# Patient Record
Sex: Male | Born: 1990
Health system: Southern US, Community
[De-identification: ages and names within clinical notes are randomized; demographics above are authoritative.]

## PROBLEM LIST (undated history)

## (undated) DIAGNOSIS — Q909 Down syndrome, unspecified: Secondary | ICD-10-CM

## (undated) DIAGNOSIS — F840 Autistic disorder: Secondary | ICD-10-CM

## (undated) DIAGNOSIS — R011 Cardiac murmur, unspecified: Secondary | ICD-10-CM

## (undated) DIAGNOSIS — R1110 Vomiting, unspecified: Secondary | ICD-10-CM

## (undated) HISTORY — DX: Vomiting, unspecified: R11.10

## (undated) HISTORY — DX: Cardiac murmur, unspecified: R01.1

## (undated) HISTORY — DX: Down syndrome, unspecified: Q90.9

## (undated) HISTORY — PX: DENTAL SURGERY: SHX609

## (undated) HISTORY — DX: Autistic disorder: F84.0

## (undated) HISTORY — PX: CARDIAC SURGERY: SHX584

---
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
## 2013-05-21 DIAGNOSIS — Z8774 Personal history of (corrected) congenital malformations of heart and circulatory system: Secondary | ICD-10-CM | POA: Insufficient documentation

## 2013-05-21 DIAGNOSIS — Q244 Congenital subaortic stenosis: Secondary | ICD-10-CM | POA: Insufficient documentation

## 2015-02-08 ENCOUNTER — Encounter: Payer: Self-pay | Admitting: Family

## 2015-02-08 ENCOUNTER — Ambulatory Visit (INDEPENDENT_AMBULATORY_CARE_PROVIDER_SITE_OTHER): Payer: 59 | Admitting: Family

## 2015-02-08 VITALS — BP 110/78 | HR 58 | Resp 14 | Ht 60.0 in

## 2015-02-08 DIAGNOSIS — R1111 Vomiting without nausea: Secondary | ICD-10-CM

## 2015-02-08 DIAGNOSIS — Q909 Down syndrome, unspecified: Secondary | ICD-10-CM | POA: Diagnosis not present

## 2015-02-08 DIAGNOSIS — R32 Unspecified urinary incontinence: Secondary | ICD-10-CM

## 2015-02-08 DIAGNOSIS — F840 Autistic disorder: Secondary | ICD-10-CM | POA: Diagnosis not present

## 2015-02-08 DIAGNOSIS — R1110 Vomiting, unspecified: Secondary | ICD-10-CM | POA: Insufficient documentation

## 2015-02-08 NOTE — Assessment & Plan Note (Signed)
Occasional urinary incontinence managed with briefs. Prescription written for medical supplies per patient request.

## 2015-02-08 NOTE — Assessment & Plan Note (Signed)
Symptoms of vomiting following meals with concern for gastroesophageal reflux or possible pyloric stenosis. Previous workup including barium swallow has been negative. Patient exam limited secondary to his comorbid conditions. Symptoms may also be related to constipation. Recommend continued over-the-counter interventions to decrease constipation including MiraLAX and prune juice as needed. Referral to gastroenterology placed for possible endoscopy. Follow-up if symptoms worsen prior to gastroenterology appointment.

## 2015-02-08 NOTE — Assessment & Plan Note (Signed)
Patient in need of supramalleolar orthotics secondary to Down syndrome development. Refer to orthopedics for further assessment and management. Follow-up if symptoms scratch that follow-up if needed prior to orthopedic appointment.

## 2015-02-08 NOTE — Progress Notes (Signed)
Subjective:    Patient ID: Nathaniel Jimenez, male    DOB: [birth year], 25 y.o.   MRN: [id number]  Chief Complaint  Patient presents with  . Establish Care    referral to GI for stomach issues, non verbal, down syndrome, and autistic    HPI:  [name] is a 25 y.o. male who  has a past medical history of Down syndrome; Autism; Heart murmur; Heart murmur; Autism; and Down syndrome. and presents today for an acute office visit to establish care. His parents are present for today's visit and provide the information as the patient has down syndrome, is non-communicative and has autism.   1.) Stomach issues - Associated symptom vomiting which occurs after he eats has been going on for about 3 years . Modifying factors include walking around which sometimes helps and prevents it. When he was little he tended to have occasional projectile vomiting. Vomiting now is described as dry heaving following by vomiting that does not appear to be projectile. Does not occur with every meal, but most meals. Has also attempted to change combinations of foods and reducing carbohydrates and sweets. Has previous been tested for Celiac and GERD as much as tolerated and has not found much conclusion. Does not appear to be in pain. Has been seen by gastroenterology in [place] with records to follow. Parents indicate the next step was recommended to be an endoscopy. He has also had a barium swallow which was normal. Parents with concern for possible migraine headaches, but unsure. Parents also express concern for constipation with bowel frequency of about 3 times per week and has been relieved with prune juice and Miralax in the past but is not used often.   2.) Need for medical equipment - Parents are requesting medical equipment to assist with care of patient. In need of Depends as patient tends to have issues with urinary incontenance and generally wears a small-medium. He is also in need of supramalleolar orthotics as his  current one are excessively worn.  No Known Allergies   No outpatient prescriptions prior to visit.   No facility-administered medications prior to visit.     Past Medical History  Diagnosis Date  . Down syndrome   . Autism   . Heart murmur   . Heart murmur   . Autism   . Down syndrome      Past Surgical History  Procedure Laterality Date  . Cardiac surgery       Family History  Problem Relation Age of Onset  . Healthy Mother   . Healthy Father   . Cancer Maternal Grandmother   . Bladder Cancer Maternal Grandfather   . Healthy Paternal Grandmother   . Healthy Paternal Grandfather      Social History   Social History  . Marital Status: Single    Spouse Name: N/A  . Number of Children: 0  . Years of Education: N/A   Occupational History  . Not on file.   Social History Main Topics  . Smoking status: Never Smoker   . Smokeless tobacco: Never Used  . Alcohol Use: No  . Drug Use: No  . Sexual Activity: Not on file   Other Topics Concern  . Not on file   Social History Narrative   Lives with parents and is non-verbal, autistic and has down syndrome.     Review of Systems  Constitutional: Negative for fever and chills.  Gastrointestinal: Positive for vomiting and constipation. Negative for abdominal pain, diarrhea  and blood in stool.      Objective:    BP 110/78 mmHg  Pulse 58  Resp 14  Ht 5' (1.524 m)  SpO2 98% Nursing note and vital signs reviewed.  Physical Exam  Constitutional: He appears well-developed and well-nourished. He appears listless. No distress.  Patient is seated in the chair between his parents who are continuously working to help keep the patient calm. Non-communicative.   Cardiovascular: Normal rate, regular rhythm, normal heart sounds and intact distal pulses.   Pulmonary/Chest: Effort normal and breath sounds normal.  Neurological: He appears listless.  Unable to assess orientation status as patient is non-communicative  with down syndrome and autism.   Skin: Skin is warm and dry.  Psychiatric:  Patient is restless and not able to sit still for long periods of time during office visit.        Assessment & Plan:   Problem List Items Addressed This Visit      Other   Vomiting - Primary    Symptoms of vomiting following meals with concern for gastroesophageal reflux or possible pyloric stenosis. Previous workup including barium swallow has been negative. Patient exam limited secondary to his comorbid conditions. Symptoms may also be related to constipation. Recommend continued over-the-counter interventions to decrease constipation including MiraLAX and prune juice as needed. Referral to gastroenterology placed for possible endoscopy. Follow-up if symptoms worsen prior to gastroenterology appointment.      Relevant Orders   Ambulatory referral to Gastroenterology   Down syndrome    Patient in need of supramalleolar orthotics secondary to Down syndrome development. Refer to orthopedics for further assessment and management. Follow-up if symptoms scratch that follow-up if needed prior to orthopedic appointment.      Relevant Orders   AMB referral to orthopedics   Autism   Urinary incontinence    Occasional urinary incontinence managed with briefs. Prescription written for medical supplies per patient request.

## 2015-02-08 NOTE — Patient Instructions (Addendum)
Thank you for choosing [facility].  Summary/Instructions:  They will call with your referrals for orthopedics and gastroenterology.   If your symptoms worsen or fail to improve, please contact our office for further instruction, or in case of emergency go directly to the emergency room at the closest medical facility.   Nausea and Vomiting Nausea is a sick feeling that often comes before throwing up (vomiting). Vomiting is a reflex where stomach contents come out of your mouth. Vomiting can cause severe loss of body fluids (dehydration). Children and elderly adults can become dehydrated quickly, especially if they also have diarrhea. Nausea and vomiting are symptoms of a condition or disease. It is important to find the cause of your symptoms. CAUSES   Direct irritation of the stomach lining. This irritation can result from increased acid production (gastroesophageal reflux disease), infection, food poisoning, taking certain medicines (such as nonsteroidal anti-inflammatory drugs), alcohol use, or tobacco use.  Signals from the brain.These signals could be caused by a headache, heat exposure, an inner ear disturbance, increased pressure in the brain from injury, infection, a tumor, or a concussion, pain, emotional stimulus, or metabolic problems.  An obstruction in the gastrointestinal tract (bowel obstruction).  Illnesses such as diabetes, hepatitis, gallbladder problems, appendicitis, kidney problems, cancer, sepsis, atypical symptoms of a heart attack, or eating disorders.  Medical treatments such as chemotherapy and radiation.  Receiving medicine that makes you sleep (general anesthetic) during surgery. DIAGNOSIS Your caregiver may ask for tests to be done if the problems do not improve after a few days. Tests may also be done if symptoms are severe or if the reason for the nausea and vomiting is not clear. Tests may include:  Urine tests.  Blood tests.  Stool  tests.  Cultures (to look for evidence of infection).  X-rays or other imaging studies. Test results can help your caregiver make decisions about treatment or the need for additional tests. TREATMENT You need to stay well hydrated. Drink frequently but in small amounts.You may wish to drink water, sports drinks, clear broth, or eat frozen ice pops or gelatin dessert to help stay hydrated.When you eat, eating slowly may help prevent nausea.There are also some antinausea medicines that may help prevent nausea. HOME CARE INSTRUCTIONS   Take all medicine as directed by your caregiver.  If you do not have an appetite, do not force yourself to eat. However, you must continue to drink fluids.  If you have an appetite, eat a normal diet unless your caregiver tells you differently.  Eat a variety of complex carbohydrates (rice, wheat, potatoes, bread), lean meats, yogurt, fruits, and vegetables.  Avoid high-fat foods because they are more difficult to digest.  Drink enough water and fluids to keep your urine clear or pale yellow.  If you are dehydrated, ask your caregiver for specific rehydration instructions. Signs of dehydration may include:  Severe thirst.  Dry lips and mouth.  Dizziness.  Dark urine.  Decreasing urine frequency and amount.  Confusion.  Rapid breathing or pulse. SEEK IMMEDIATE MEDICAL CARE IF:   You have blood or brown flecks (like coffee grounds) in your vomit.  You have black or bloody stools.  You have a severe headache or stiff neck.  You are confused.  You have severe abdominal pain.  You have chest pain or trouble breathing.  You do not urinate at least once every 8 hours.  You develop cold or clammy skin.  You continue to vomit for longer than 24 to 48  hours.  You have a fever. MAKE SURE YOU:   Understand these instructions.  Will watch your condition.  Will get help right away if you are not doing well or get worse.   This  information is not intended to replace advice given to you by your health care provider. Make sure you discuss any questions you have with your health care provider.   Document Released: 01/07/2005 Document Revised: 04/01/2011 Document Reviewed: 06/06/2010 Elsevier Interactive Patient Education [employer]

## 2015-02-09 ENCOUNTER — Encounter: Payer: Self-pay | Admitting: Physician Assistant

## 2015-02-28 ENCOUNTER — Ambulatory Visit (INDEPENDENT_AMBULATORY_CARE_PROVIDER_SITE_OTHER): Payer: 59 | Admitting: Physician Assistant

## 2015-02-28 ENCOUNTER — Encounter: Payer: Self-pay | Admitting: Physician Assistant

## 2015-02-28 VITALS — BP 104/64 | Ht 60.0 in | Wt 97.4 lb

## 2015-02-28 DIAGNOSIS — R1110 Vomiting, unspecified: Secondary | ICD-10-CM | POA: Insufficient documentation

## 2015-02-28 DIAGNOSIS — R112 Nausea with vomiting, unspecified: Secondary | ICD-10-CM | POA: Diagnosis not present

## 2015-02-28 DIAGNOSIS — K219 Gastro-esophageal reflux disease without esophagitis: Secondary | ICD-10-CM | POA: Diagnosis not present

## 2015-02-28 DIAGNOSIS — IMO0001 Reserved for inherently not codable concepts without codable children: Secondary | ICD-10-CM

## 2015-02-28 NOTE — Patient Instructions (Signed)
Take over the counter Zantac 75 mg by mouth before breakfast and before dinner.   Take Miralax daily as needed. You may adjust the dose.  We have given you anti-reflux information.  You have been scheduled for a Barium Esophogram at [facility] , Go to the [facility], follow signs to radiology. on 03-03-2015 at 8:30 am . Please arrive at 8:15 am to your appointment for registration. Make certain not to have anything to eat or drink 6 hours prior to your test. If you need to reschedule for any reason, please contact radiology at [phone] to do so. __________________________________________________________________ A barium swallow is an examination that concentrates on views of the esophagus. This tends to be a double contrast exam (barium and two liquids which, when combined, create a gas to distend the wall of the oesophagus) or single contrast (non-ionic iodine based). The study is usually tailored to your symptoms so a good history is essential. Attention is paid during the study to the form, structure and configuration of the esophagus, looking for functional disorders (such as aspiration, dysphagia, achalasia, motility and reflux) EXAMINATION You may be asked to change into a gown, depending on the type of swallow being performed. A radiologist and radiographer will perform the procedure. The radiologist will advise you of the type of contrast selected for your procedure and direct you during the exam. You will be asked to stand, sit or lie in several different positions and to hold a small amount of fluid in your mouth before being asked to swallow while the imaging is performed .In some instances you may be asked to swallow barium coated marshmallows to assess the motility of a solid food bolus. The exam can be recorded as a digital or video fluoroscopy procedure. POST PROCEDURE It will take 1-2 days for the barium to pass through your system. To facilitate this, it is  important, unless otherwise directed, to increase your fluids for the next 24-48hrs and to resume your normal diet.  This test typically takes about 30 minutes to perform. __________________________________________________________________________________

## 2015-02-28 NOTE — Progress Notes (Signed)
Patient ID: Nathaniel Jimenez, male   DOB: 05-06-1990, 25 y.o.   MRN: [id number]   Subjective:    Patient ID: Nathaniel Jimenez, male    DOB: [birth year], 25 y.o.   MRN: [id number]  HPI  Gen  is a 25 year old white male new to GI referred by Marcos Eke NP . Patient has Down syndrome and autism. He is nonverbal and is accompanied by his parents. He has history of congenital heart disease and underwent cardiac surgeries as an infant. Parents relate that he had undergone some GI evaluation when they were living in [place] a few years ago. They brought some records with them which show that he did have a speech path eval  done in November 2014 which was negative for evidence of aspiration or penetration. They are concerned as he has had frequent vomiting after meals over the past 3 or 4 years. They relate that as a baby he spit up a lot. He do feel that certain foods tend to aggravate his current symptoms, primarily fatty heavy greasy foods and cake-type items. He does not appear to be in any discomfort and they do not feel that he is actually nauseated but rather just "spits up" within 20-30 minutes after meals. This does not happen every day but seems to happen frequently and more often after dinner. He does not chew his food well and eats very fast and has to be watched t while he's eating his meals. Also noticed that if he  drinks a lot with a meal this will increase his symptoms as well. Apter is not a sense that he has heartburn or discomfort. He does not appear to have difficulty swallowing. They said that frequently after eating he will chew on a towel and when he does this activity will have less difficulty with spitting up.   They were given a trial of Dexilant  per primary care this seemed to aggravate his chronic constipation and he stopped it. They have not tried any OTC meds. Patient has been tested in the past for celiac disease which was negative.  His appetite seems to be fine and weight is stable  he has gradually lost some weight over the past several years. His chronic constipation is being managed by MiraLAX as needed currently.  Review of Systems Pertinent positive and negative review of systems were noted in the above HPI section.  All other review of systems was otherwise negative.  No outpatient encounter prescriptions on file as of 02/28/2015.   No facility-administered encounter medications on file as of 02/28/2015.   No Known Allergies Patient Active Problem List   Diagnosis Date Noted  . Regurgitation of food 02/28/2015  . Vomiting 02/08/2015  . Down syndrome 02/08/2015  . Autism 02/08/2015  . Urinary incontinence 02/08/2015   Social History   Social History  . Marital Status: Single    Spouse Name: N/A  . Number of Children: 0  . Years of Education: N/A   Occupational History  . Not on file.   Social History Main Topics  . Smoking status: Never Smoker   . Smokeless tobacco: Never Used  . Alcohol Use: No  . Drug Use: No  . Sexual Activity: Not on file   Other Topics Concern  . Not on file   Social History Narrative   Lives with parents and is non-verbal, autistic and has down syndrome.     Mr. Degregory family history includes Bladder Cancer in his maternal grandfather; Cancer in his  maternal grandmother; Healthy in his father, mother, paternal grandfather, and paternal grandmother.      Objective:    Filed Vitals:   02/28/15 0932  BP: 104/64    Physical Exam  well-developed young white male with Down syndrome, nonverbal and constantly moving. Accompanied by his parents. Blood pressure 104/64 height 5 foot weight 97. HEENT; nontraumatic, cephalic EOMI PERRLA, Cardiovascular; regular rate and rhythm with S1-S2, Pulmonary ;clear bilaterally, Abdomen; soft nontender nondistended bowel sounds are active palpable mass or hepatosplenomegaly, Rectal; exam not done, Extremities; no clubbing cyanosis or edema skin warm and dry, Neuropsych ;patient is autistic  and nonverbal, no eye contact     Assessment & Plan:   #1 25 yo male with Downs syndrome and Autism with chronic post prandial regurgitation vs reflux #2 s/p repair of congenital heart defects #3 nonverbal  Plan; Will schedule for barium swallow with tablet Antireflux diet and regimen with small more frequent meals Trial of Zantac 75 mg ac breakfast and ac dinner. Follow up will be as needed, pt will be established with Dr Russella Dar   Sammuel Cooper PA-C 02/28/2015   Cc: Veryl Speak, FNP

## 2015-03-01 ENCOUNTER — Other Ambulatory Visit: Payer: Self-pay | Admitting: Family

## 2015-03-01 ENCOUNTER — Encounter: Payer: Self-pay | Admitting: Family

## 2015-03-01 DIAGNOSIS — Q909 Down syndrome, unspecified: Secondary | ICD-10-CM

## 2015-03-01 NOTE — Progress Notes (Signed)
Reviewed and agree with management plan.  Trino Higinbotham T. Libbie Bartley, MD FACG 

## 2015-03-02 NOTE — Progress Notes (Signed)
Referral sent to [facility].

## 2015-03-03 ENCOUNTER — Ambulatory Visit (HOSPITAL_COMMUNITY): Payer: 59

## 2015-03-06 ENCOUNTER — Ambulatory Visit (HOSPITAL_COMMUNITY)
Admission: RE | Admit: 2015-03-06 | Discharge: 2015-03-06 | Disposition: A | Discharge status: Home / Self Care | Payer: 59 | Source: Ambulatory Visit | Attending: Physician Assistant | Admitting: Physician Assistant

## 2015-03-06 DIAGNOSIS — K219 Gastro-esophageal reflux disease without esophagitis: Secondary | ICD-10-CM | POA: Diagnosis not present

## 2015-03-06 DIAGNOSIS — K8020 Calculus of gallbladder without cholecystitis without obstruction: Secondary | ICD-10-CM | POA: Insufficient documentation

## 2015-03-06 DIAGNOSIS — R1110 Vomiting, unspecified: Secondary | ICD-10-CM

## 2015-03-06 DIAGNOSIS — R112 Nausea with vomiting, unspecified: Secondary | ICD-10-CM | POA: Insufficient documentation

## 2015-03-06 DIAGNOSIS — IMO0001 Reserved for inherently not codable concepts without codable children: Secondary | ICD-10-CM

## 2015-03-10 ENCOUNTER — Encounter: Payer: Self-pay | Admitting: Family

## 2015-03-16 ENCOUNTER — Encounter: Payer: Self-pay | Admitting: Family

## 2015-03-22 ENCOUNTER — Encounter: Payer: Self-pay | Admitting: Family

## 2015-03-27 ENCOUNTER — Ambulatory Visit (INDEPENDENT_AMBULATORY_CARE_PROVIDER_SITE_OTHER): Payer: 59 | Admitting: Sports Medicine

## 2015-03-27 ENCOUNTER — Encounter: Payer: Self-pay | Admitting: Sports Medicine

## 2015-03-27 DIAGNOSIS — M79672 Pain in left foot: Secondary | ICD-10-CM

## 2015-03-27 DIAGNOSIS — M79671 Pain in right foot: Secondary | ICD-10-CM | POA: Diagnosis not present

## 2015-03-27 DIAGNOSIS — Q669 Congenital deformity of feet, unspecified, unspecified foot: Secondary | ICD-10-CM

## 2015-03-27 DIAGNOSIS — Q6650 Congenital pes planus, unspecified foot: Secondary | ICD-10-CM | POA: Diagnosis not present

## 2015-03-27 DIAGNOSIS — Q909 Down syndrome, unspecified: Secondary | ICD-10-CM

## 2015-03-27 NOTE — Progress Notes (Signed)
Patient ID: Nathaniel Jimenez, male   DOB: [birth year], 26 y.o.   MRN: [id number] Subjective: Nathaniel Jimenez is a 25 y.o. male patient who presents to office with dad for evaluation of Supra-malleolar orthotics. Patient's dad states that orthotics have helped; they have recently moved and last pair of SMOs were 3 years ago; admits to increased irritation and callus formation on bottom of right foot. No other issues.  Admits that has been using orthotics >15 years and have tried larger and higher AFOs that do not work well for patient.   Patient Active Problem List   Diagnosis Date Noted  . Regurgitation of food 02/28/2015  . Vomiting 02/08/2015  . Down syndrome 02/08/2015  . Autism 02/08/2015  . Urinary incontinence 02/08/2015    No current outpatient prescriptions on file prior to visit.   No current facility-administered medications on file prior to visit.    No Known Allergies  Objective:  General: Alert and oriented x3 in no acute distress  Dermatology: Mild callus sub met 1 and hallux with no signs of infection, Mild irritation at right medial malleolus and midfoot with no signs of infection, No open lesions bilateral lower extremities, no webspace macerations, no ecchymosis bilateral, all nails x 10 are well manicured.  Vascular: Dorsalis Pedis and Posterior Tibial pedal pulses 2/4, Capillary Fill Time 3 seconds, + pedal hair growth bilateral, no edema bilateral lower extremities, Temperature gradient within normal limits.  Neurology: Unable to discern due to patient status   Musculoskeletal:Increased ligament laxity and increased toncity with guarding and flexing during exam; Significant Pes Planus. Orthotics do not appear to fit well, mild gapping providing inadaquate support  Gait: Non-Antalgic, Abducted, significant collapsing pes planus with R>L medial 1st MTPJ roll-off  Xrays: Unable to obtain due to patient status   Assessment and Plan: Problem List Items Addressed This Visit     None    Visit Diagnoses    Foot deformity, congenital    -  Primary    Congenital pes planus, unspecified laterality        Down's syndrome        Foot pain, bilateral          -Complete examination performed -Recommend skin emollient for callus skin on right foot -Gave patient moleskin to apply to areas of irritation and to protect/pad areas to prevent skin breakdown from [facility] -Patient to be evaluated by Benjie Karvonen for casting and fitting for [facility] -Patient to return to office for evaluation with [facility] or sooner if condition worsens or problems arise.  Landis Martins, DPM

## 2015-03-30 ENCOUNTER — Ambulatory Visit: Payer: 59 | Admitting: *Deleted

## 2015-03-30 DIAGNOSIS — Q669 Congenital deformity of feet, unspecified, unspecified foot: Secondary | ICD-10-CM

## 2015-03-30 NOTE — Progress Notes (Signed)
Patient ID: Nathaniel LeatherwoodRobert Jimenez, male   DOB: 12/17/1990, 25 y.o.   MRN: [id number] Patient [facility].  Patient will return in 4 weeks to be fitted.

## 2015-04-03 ENCOUNTER — Encounter: Payer: Self-pay | Admitting: Family

## 2015-04-03 DIAGNOSIS — F840 Autistic disorder: Secondary | ICD-10-CM

## 2015-04-06 ENCOUNTER — Encounter: Payer: Self-pay | Admitting: Family

## 2015-04-13 ENCOUNTER — Encounter: Payer: Self-pay | Admitting: Family

## 2015-04-24 ENCOUNTER — Other Ambulatory Visit: Payer: 59

## 2015-04-25 ENCOUNTER — Encounter: Payer: Self-pay | Admitting: Family

## 2015-04-28 ENCOUNTER — Telehealth: Payer: Self-pay | Admitting: Family

## 2015-04-28 NOTE — Telephone Encounter (Signed)
Patients father faxed over forms to be completed for Day program that patient is starting next week.  Wanted to make sure this was received.

## 2015-05-01 NOTE — Telephone Encounter (Signed)
Paperwork received, completed and signed.  Original in cabinet for pick up, copy to scan. Father notified via personal VM

## 2015-05-22 ENCOUNTER — Ambulatory Visit: Payer: 59 | Admitting: *Deleted

## 2015-05-22 DIAGNOSIS — M2140 Flat foot [pes planus] (acquired), unspecified foot: Secondary | ICD-10-CM

## 2015-05-22 DIAGNOSIS — M79671 Pain in right foot: Secondary | ICD-10-CM

## 2015-05-22 DIAGNOSIS — M79672 Pain in left foot: Principal | ICD-10-CM

## 2015-05-22 NOTE — Progress Notes (Signed)
Patient ID: Natalia LeatherwoodRobert Henrie, male   DOB: 02/01/1990, 25 y.o.   MRN: [id number] Patient [facility] [place] [facility]. Written and verbal break in instructions given. Patient will follow up in 6 weeks with Dr. Marylene LandStover

## 2015-06-08 ENCOUNTER — Encounter: Payer: Self-pay | Admitting: Physician Assistant

## 2015-06-08 ENCOUNTER — Telehealth: Payer: Self-pay | Admitting: Physician Assistant

## 2015-06-09 NOTE — Telephone Encounter (Signed)
Mychart message

## 2015-06-21 ENCOUNTER — Ambulatory Visit (INDEPENDENT_AMBULATORY_CARE_PROVIDER_SITE_OTHER): Payer: 59 | Admitting: Psychology

## 2015-06-21 DIAGNOSIS — F840 Autistic disorder: Secondary | ICD-10-CM

## 2015-06-21 DIAGNOSIS — F89 Unspecified disorder of psychological development: Secondary | ICD-10-CM

## 2015-06-21 DIAGNOSIS — F79 Unspecified intellectual disabilities: Secondary | ICD-10-CM | POA: Diagnosis not present

## 2015-06-23 ENCOUNTER — Ambulatory Visit (INDEPENDENT_AMBULATORY_CARE_PROVIDER_SITE_OTHER): Payer: 59 | Admitting: Psychology

## 2015-06-23 DIAGNOSIS — F840 Autistic disorder: Secondary | ICD-10-CM

## 2015-06-23 DIAGNOSIS — F78 Other intellectual disabilities: Secondary | ICD-10-CM

## 2015-07-30 ENCOUNTER — Encounter: Payer: Self-pay | Admitting: Sports Medicine

## 2015-07-31 ENCOUNTER — Encounter: Payer: Self-pay | Admitting: Sports Medicine

## 2015-07-31 ENCOUNTER — Ambulatory Visit (INDEPENDENT_AMBULATORY_CARE_PROVIDER_SITE_OTHER): Payer: 59 | Admitting: Sports Medicine

## 2015-07-31 DIAGNOSIS — Q669 Congenital deformity of feet, unspecified, unspecified foot: Secondary | ICD-10-CM

## 2015-07-31 DIAGNOSIS — M79672 Pain in left foot: Secondary | ICD-10-CM

## 2015-07-31 DIAGNOSIS — Q6650 Congenital pes planus, unspecified foot: Secondary | ICD-10-CM

## 2015-07-31 DIAGNOSIS — M79671 Pain in right foot: Secondary | ICD-10-CM

## 2015-07-31 DIAGNOSIS — Q909 Down syndrome, unspecified: Secondary | ICD-10-CM

## 2015-07-31 NOTE — Progress Notes (Signed)
Patient ID: Nathaniel Jimenez, male   DOB: 1990-03-08, 26 y.o.   MRN: [id number]  Subjective: Nathaniel Jimenez is a 25 y.o. male patient who returns to office with dad and mom for follow up evaluation after wearing Supra-malleolar orthotics (SMOs). Patient's dad and mother states that SMOs caused blistering at irritation at back of legs and 5th met head after 1 weeks. No other issues.   Patient Active Problem List   Diagnosis Date Noted  . Regurgitation of food 02/28/2015  . Vomiting 02/08/2015  . Down syndrome 02/08/2015  . Autism 02/08/2015  . Urinary incontinence 02/08/2015    No current outpatient prescriptions on file prior to visit.   No current facility-administered medications on file prior to visit.    No Known Allergies  Objective:  General: Alert and oriented x3 in no acute distress  Dermatology: Mild callus sub met 1 and hallux with no signs of infection, Mild irritation at right 5th met ad posterior calves with no signs of infection, No open lesions bilateral lower extremities, no webspace macerations, no ecchymosis bilateral, all nails x 10 are well manicured.  Vascular: Dorsalis Pedis and Posterior Tibial pedal pulses 2/4, Capillary Fill Time 3 seconds, + pedal hair growth bilateral, no edema bilateral lower extremities, Temperature gradient within normal limits.  Neurology: Unable to discern due to patient status   Musculoskeletal:Increased ligament laxity and increased toncity with guarding and flexing during exam; Significant Pes Planus.   Assessment and Plan: Problem List Items Addressed This Visit    None    Visit Diagnoses    Congenital pes planus, unspecified laterality    -  Primary    Foot pain, bilateral        Foot deformity, congenital        Down's syndrome          -Complete examination performed -Recommend continue with skin emollient for callus skin on right foot -Gave patient moleskin to apply to areas of irritation and to protect/pad areas to prevent  skin breakdown from [place] to send current SMOs back to lab to be cut down and re-padded  -Patient to return to office for Pick up SMOs when ready or sooner if condition worsens or problems arise.  Landis Martins, DPM

## 2015-10-23 ENCOUNTER — Other Ambulatory Visit: Payer: 59

## 2016-02-09 ENCOUNTER — Encounter: Payer: Self-pay | Admitting: Family

## 2016-02-09 ENCOUNTER — Ambulatory Visit (INDEPENDENT_AMBULATORY_CARE_PROVIDER_SITE_OTHER): Payer: 59 | Admitting: Family

## 2016-02-09 VITALS — BP 100/68 | HR 75 | Resp 16 | Ht 60.0 in | Wt 99.1 lb

## 2016-02-09 DIAGNOSIS — Z0001 Encounter for general adult medical examination with abnormal findings: Secondary | ICD-10-CM | POA: Insufficient documentation

## 2016-02-09 DIAGNOSIS — Q909 Down syndrome, unspecified: Secondary | ICD-10-CM

## 2016-02-09 DIAGNOSIS — F840 Autistic disorder: Secondary | ICD-10-CM | POA: Diagnosis not present

## 2016-02-09 NOTE — Progress Notes (Signed)
Subjective:    Patient ID: Nathaniel Jimenez, male    DOB: 08/01/1990, 26 y.o.   MRN: [id number]  Chief Complaint  Patient presents with  . CPE    not fasting    HPI:  Nathaniel Jimenez is a 26 y.o. male who presents today for an annual wellness visit.   1) Health Maintenance -   Diet -  Averages about 3-5 meals per day consisting of a regular diet however he does continue to have issues with regurgitation; No caffeine intake.   Exercise - Exercises regularly at his daytime program.    2) Preventative Exams / Immunizations:  Dental -- Up to date  Vision -- Due for exam.    Health Maintenance  Topic Date Due  . HIV Screening  12/28/2007  . INFLUENZA VACCINE  04/20/2016 (Originally 08/22/2015)  . TETANUS/TDAP  09/15/2016    Immunization History  Administered Date(s) Administered  . Hepatitis B 01/26/1993, 02/23/1993, 10/02/1993  . IPV 02/23/1993, 04/24/1993, 04/18/1994, 09/29/1997  . MMR 04/18/1994, 03/10/1997  . Meningococcal Conjugate 09/16/2006  . Tdap 09/16/2006  . Varicella 07/16/1997     No Known Allergies   No outpatient prescriptions prior to visit.   No facility-administered medications prior to visit.      Past Medical History:  Diagnosis Date  . Autism   . Autism   . Down syndrome   . Down syndrome   . Heart murmur   . Heart murmur      Past Surgical History:  Procedure Laterality Date  . CARDIAC SURGERY       Family History  Problem Relation Age of Onset  . Healthy Mother   . Healthy Father   . Cancer Maternal Grandmother   . Bladder Cancer Maternal Grandfather   . Healthy Paternal Grandmother   . Healthy Paternal Grandfather      Social History   Social History  . Marital status: Single    Spouse name: N/A  . Number of children: 0  . Years of education: N/A   Occupational History  . Not on file.   Social History Main Topics  . Smoking status: Never Smoker  . Smokeless tobacco: Never Used  . Alcohol use No  . Drug use:  No  . Sexual activity: Not on file   Other Topics Concern  . Not on file   Social History Narrative   Lives with parents and is non-verbal, autistic and has down syndrome.       Review of Systems  Unable to complete - Autism and Down Syndrome.     Objective:     BP 100/68 (BP Location: Left Arm, Patient Position: Sitting, Cuff Size: Normal)   Pulse 75   Resp 16   Ht 5' (1.524 m)   Wt 99 lb 1.9 oz (45 kg)   SpO2 95%   BMI 19.36 kg/m  Nursing note and vital signs reviewed.  Physical Exam  Constitutional: He appears well-developed and well-nourished. No distress.  HENT:  Right Ear: Hearing, tympanic membrane, external ear and ear canal normal.  Left Ear: Hearing, tympanic membrane, external ear and ear canal normal.  Nose: Nose normal.  Mouth/Throat: Uvula is midline, oropharynx is clear and moist and mucous membranes are normal.  Eyes: Conjunctivae and EOM are normal. Pupils are equal, round, and reactive to light.  Neck: Neck supple. No JVD present. No tracheal deviation present. No thyromegaly present.  Cardiovascular: Normal rate, regular rhythm and intact distal pulses.  Exam reveals no gallop  and no friction rub.   Murmur heard. Pulmonary/Chest: Effort normal and breath sounds normal. No respiratory distress. He has no wheezes. He has no rales. He exhibits no tenderness.  Abdominal: Soft. Bowel sounds are normal. He exhibits no distension and no mass. There is no tenderness. There is no rebound and no guarding.  Musculoskeletal: Normal range of motion. He exhibits no edema or tenderness.  Lymphadenopathy:    He has no cervical adenopathy.  Neurological: He is alert. He has normal reflexes. He exhibits abnormal muscle tone. Coordination abnormal.  Unable to determine orientation and does follow some basic commands from father.   Skin: Skin is warm and dry.  Psychiatric: Cognition and memory are impaired. He expresses impulsivity. He is noncommunicative.         Assessment & Plan:   Problem List Items Addressed This Visit      Other   Down syndrome   Autism   Encounter for general adult medical examination with abnormal findings - Primary    1) Anticipatory Guidance: Discussed importance of wearing a seatbelt while driving and not texting while driving; changing batteries in smoke detector at least once annually; wearing suntan lotion when outside; eating a balanced and moderate diet; getting physical activity at least 30 minutes per day.  2) Immunizations / Screenings / Labs:  Father declines influenza. May be due for tetanus and TB skin test which can be completed through nurse visit. All other immunizations are up to date per recommendations. Due for a vision exam encouraged to be completed independently. All other screenings are up to date per recommendations. Obtain CBC, CMET, and lipid profile.    Overall well exam. Continues to have difficulty with eating on occasion secondary to regurgitation. His weight appears stable and has actually gained 2 pounds since his last office visit. He gets regular exercise. Parents and family appear very supportive and he continues to make strides with his current services. Continue healthy lifestyle choices with assistance from parents. Follow up prevention exam in 1 year and follow up office visit pending blood work or as needed.        Relevant Orders   CBC   Comprehensive metabolic panel   Lipid panel       Mr. Tome does not currently have medications on file.   Follow-up: Return in about 1 year (around 02/08/2017), or if symptoms worsen or fail to improve.   Mauricio Po, FNP

## 2016-02-09 NOTE — Assessment & Plan Note (Signed)
1) Anticipatory Guidance: Discussed importance of wearing a seatbelt while driving and not texting while driving; changing batteries in smoke detector at least once annually; wearing suntan lotion when outside; eating a balanced and moderate diet; getting physical activity at least 30 minutes per day.  2) Immunizations / Screenings / Labs:  Father declines influenza. May be due for tetanus and TB skin test which can be completed through nurse visit. All other immunizations are up to date per recommendations. Due for a vision exam encouraged to be completed independently. All other screenings are up to date per recommendations. Obtain CBC, CMET, and lipid profile.    Overall well exam. Continues to have difficulty with eating on occasion secondary to regurgitation. His weight appears stable and has actually gained 2 pounds since his last office visit. He gets regular exercise. Parents and family appear very supportive and he continues to make strides with his current services. Continue healthy lifestyle choices with assistance from parents. Follow up prevention exam in 1 year and follow up office visit pending blood work or as needed.

## 2016-02-09 NOTE — Patient Instructions (Addendum)
Thank you for choosing [facility].  SUMMARY AND INSTRUCTIONS:  Labs can be completed at your convenience.  Please check on TB test and Tetanus. Both can be updated during a nurse visit. The only day that TB is not done is Thursday.  Overall everything looks and sounds good!  Labs:  Please stop by the lab on the lower level of the building for your blood work. Your results will be released to [facility] (or called to you) after review, usually within 72 hours after test completion. If any changes need to be made, you will be notified at that same time.  1.) The lab is open from 7:30am to 5:30 pm Monday-Friday 2.) No appointment is necessary 3.) Fasting (if needed) is 6-8 hours after food and drink; black coffee and water are okay    Follow up:  If your symptoms worsen or fail to improve, please contact our office for further instruction, or in case of emergency go directly to the emergency room at the closest medical facility.     Preventive Care for Nathaniel Jimenez, Male The transition to life after high school as a young adult can be a stressful time with many changes. You may start seeing a primary care physician instead of a pediatrician. This is the time when your health care becomes your responsibility. Preventive care refers to lifestyle choices and visits with your health care provider that can promote health and wellness. What does preventive care include?  A yearly physical exam. This is also called an annual wellness visit.  Dental exams once or twice a year.  Routine eye exams. Ask your health care provider how often you should have your eyes checked.  Personal lifestyle choices, including:  Daily care of your teeth and gums.  Regular physical activity.  Eating a healthy diet.  Avoiding tobacco and drug use.  Avoiding or limiting alcohol use.  Practicing safe sex. What happens during an annual wellness visit? Preventive care starts with a yearly visit to  your primary care physician. The services and screenings done by your health care provider during your annual wellness visit will depend on your overall health, lifestyle risk factors, and family history of disease. Counseling  Your health care provider may ask you questions about:  Past medical problems and your family's medical history.  Medicines or supplements that you take.  Health insurance and access to health care.  Alcohol, tobacco, and drug use, including use of any bodybuilding drugs (anabolic steroids).  Your safety at home, work, or school.  Access to firearms.  Emotional well-being and how you cope with stress.  Relationship well-being.  Diet, exercise, and sleep habits.  Your sexual health and activity. Screening  You may have the following tests or measurements:  Height, weight, and BMI.  Blood pressure.  Lipid and cholesterol levels.  Tuberculosis skin test.  Skin exam.  Vision and hearing tests.  Genital exam to check for testicular cancer or hernias.  Screening test for hepatitis.  Screening tests for STDs (sexually transmitted diseases), if you are at risk. Vaccines  Your health care provider may recommend certain vaccines, such as:  Influenza vaccine. This is recommended every year.  Tetanus, diphtheria, and acellular pertussis (Tdap, Td) vaccine. You may need a Td booster every 10 years.  Varicella vaccine. You may need this if you have not been vaccinated.  HPV vaccine. If you are 33 or younger, you may need three doses over 6 months.  Measles, mumps, and rubella (MMR) vaccine.  You may need at least one dose of MMR. You may also need a second dose.  Pneumococcal 13-valent conjugate (PCV13) vaccine. You may need this if you have certain conditions and have not been vaccinated.  Pneumococcal polysaccharide (PPSV23) vaccine. You may need one or two doses if you smoke cigarettes or if you have certain conditions.  Meningococcal vaccine.  One dose is recommended if you are age 80-21 years and a first-year college student living in a residence hall, or if you have one of several medical conditions. You may also need additional booster doses.  Hepatitis A vaccine. You may need this if you have certain conditions or if you travel or work in places where you may be exposed to hepatitis A.  Hepatitis B vaccine. You may need this if you have certain conditions or if you travel or work in places where you may be exposed to hepatitis B.  Haemophilus influenzae type b (Hib) vaccine. You may need this if you have certain risk factors. Talk to your health care provider about which screenings and vaccines you need and how often you need them. What steps can I take to develop healthy behaviors?  Have regular preventive health care visits with your primary care physician and dentist.  Eat a healthy diet.  Drink enough fluid to keep your urine clear or pale yellow.  Stay active. Exercise at least 30 minutes 5 or more days of the week.  Use alcohol responsibly.  Maintain a healthy weight.  Do not use any products that contain nicotine, such as cigarettes, chewing tobacco, and e-cigarettes. If you need help quitting, ask your health care provider.  Do not use drugs.  Practice safe sex. This includes using condoms to prevent STDs or an unwanted pregnancy.  Find healthy ways to manage stress. How can I protect myself from injury? Injuries from violence or accidents are the leading cause of death among young adults and can often be prevented. Take these steps to help protect yourself:  Always wear your seat belt while driving or riding in a vehicle.  Do not drive if you have been drinking alcohol. Do not ride with someone who has been drinking.  Do not drive when you are tired or distracted. Do not text while driving.  Wear a helmet and other protective equipment during sports activities.  If you have firearms in your house, make  sure you follow all gun safety procedures.  Seek help if you have been bullied, physically abused, or sexually abused.  Avoid fighting.  Use the Internet responsibly to avoid dangers such as online bullying. What can I do to cope with stress? Young adults may face many new challenges that can be stressful, such as finding a job, going to college, moving away from home, managing money, being in a relationship, getting married, and having children. To manage stress:  Avoid known stressful situations when you can.  Exercise regularly.  Find a stress-reducing activity that works best for you. Examples include meditation, yoga, listening to music, or reading.  Spend time in nature.  Keep a journal to write about your stress and how you respond.  Talk to your health care provider about stress. He or she may suggest counseling.  Spend time with supportive friends or family.  Do not cope with stress by:  Drinking alcohol or using drugs.  Smoking cigarettes.  Eating. Where can I get more information? Learn more about preventive care and healthy habits from:  U.S. Preventive Services Task Force:  StageSync.si  National Adolescent and [place]: StrategicRoad.nl  American Academy of Pediatrics Bright Futures: https://brightfutures.MemberVerification.co.za  Society for Adolescent Health and Medicine: MoralBlog.co.za.aspx  PodExchange.nl: ToyLending.fr This information is not intended to replace advice given to you by your health care provider. Make sure you discuss any questions you have with your health care provider. Document Released: 05/25/2015 Document Revised: 06/15/2015 Document Reviewed: 05/25/2015 Elsevier Interactive Patient Education  2017  [employer].

## 2016-05-26 ENCOUNTER — Encounter: Payer: Self-pay | Admitting: Family

## 2016-05-26 DIAGNOSIS — Q909 Down syndrome, unspecified: Secondary | ICD-10-CM

## 2016-06-18 ENCOUNTER — Telehealth: Payer: Self-pay | Admitting: Family

## 2016-06-20 ENCOUNTER — Ambulatory Visit: Payer: 59 | Admitting: Family

## 2016-06-21 ENCOUNTER — Encounter: Payer: Self-pay | Admitting: Family

## 2016-06-21 ENCOUNTER — Ambulatory Visit (INDEPENDENT_AMBULATORY_CARE_PROVIDER_SITE_OTHER): Payer: 59 | Admitting: Family

## 2016-06-21 VITALS — BP 102/60 | Resp 20 | Ht 60.0 in | Wt 101.8 lb

## 2016-06-21 DIAGNOSIS — H6691 Otitis media, unspecified, right ear: Secondary | ICD-10-CM | POA: Insufficient documentation

## 2016-06-21 DIAGNOSIS — H65191 Other acute nonsuppurative otitis media, right ear: Secondary | ICD-10-CM | POA: Diagnosis not present

## 2016-06-21 MED ORDER — AMOXICILLIN 500 MG PO TABS: 500.0000 mg | ORAL_TABLET | Freq: Two times a day (BID) | ORAL | 0 refills | Status: DC

## 2016-06-21 NOTE — Progress Notes (Signed)
   Subjective:    Patient ID: Natalia Leatherwoodobert Sickinger, male    DOB: 04/17/1990, 26 y.o.   MRN: [id number]  Chief Complaint  Patient presents with  . Ear Fullness    HPI:  Natalia LeatherwoodRobert Diefendorf is a 26 y.o. male who  has a past medical history of Autism; Autism; Down syndrome; Down syndrome; Heart murmur; and Heart murmur. and presents today for an office visit. Parents are present for today's visit and provide the history as patient is non-verbal.   This is a new problem. Associated symptom of rubbing his bilateral ears has been going on for a couple of weeks. Family notes that he has been acting like things are normal. No fevers or discharge. Continues to respond as normal. No notable decreases in hearing. Does have some congestion. Course of the symptoms has remained constant.   No Known Allergies    No outpatient prescriptions prior to visit.   No facility-administered medications prior to visit.       Past Surgical History:  Procedure Laterality Date  . CARDIAC SURGERY        Past Medical History:  Diagnosis Date  . Autism   . Autism   . Down syndrome   . Down syndrome   . Heart murmur   . Heart murmur       Review of Systems  Unable to perform ROS: Patient nonverbal      Objective:    BP 102/60 (BP Location: Left Arm, Patient Position: Sitting, Cuff Size: Normal)   Resp 20   Ht 5' (1.524 m)   Wt 101 lb 12.8 oz (46.2 kg)   SpO2 96%   BMI 19.88 kg/m  Nursing note and vital signs reviewed.  Physical Exam  Constitutional: He is oriented to person, place, and time. He appears well-developed and well-nourished. No distress.  HENT:  Right Ear: Tympanic membrane is erythematous (Mild).  Left Ear: Hearing, tympanic membrane and ear canal normal.  Cardiovascular: Normal rate, regular rhythm, normal heart sounds and intact distal pulses.   Pulmonary/Chest: Effort normal and breath sounds normal.  Neurological: He is alert and oriented to person, place, and time.  Skin: Skin  is warm and dry.  Psychiatric: His affect is labile. He expresses impulsivity. He is noncommunicative.  Restless at times.        Assessment & Plan:   Problem List Items Addressed This Visit      Nervous and Auditory   Right otitis media - Primary    New onset ear rubbing and congestion with symptoms consistent with otitis media. Ear exam difficult to perform secondary to patient cooperation. Start amoxicillin. Follow up if symptoms worsen or do not improve.       Relevant Medications   amoxicillin (AMOXIL) 500 MG tablet       I am having Mr. Malva CoganDelos start on amoxicillin.   Meds ordered this encounter  Medications  . amoxicillin (AMOXIL) 500 MG tablet    Sig: Take 1 tablet (500 mg total) by mouth 2 (two) times daily.    Dispense:  14 tablet    Refill:  0    Order Specific Question:   Supervising Provider    Answer:   Hillard DankerRAWFORD, ELIZABETH A [[id number]]     Follow-up: Return if symptoms worsen or fail to improve.  Jeanine Luzalone, Stefany Starace, FNP

## 2016-06-21 NOTE — Patient Instructions (Addendum)
Thank you for choosing [facility].  SUMMARY AND INSTRUCTIONS:  Please start the amoxicillin.   Medication:  Your prescription(s) have been submitted to your pharmacy or been printed and provided for you. Please take as directed and contact our office if you believe you are having problem(s) with the medication(s) or have any questions.  Follow up:  If your symptoms worsen or fail to improve, please contact our office for further instruction, or in case of emergency go directly to the emergency room at the closest medical facility.    Otitis Media, Adult Otitis media is redness, soreness, and puffiness (swelling) in the space just behind your eardrum (middle ear). It may be caused by allergies or infection. It often happens along with a cold. Follow these instructions at home:  Take your medicine as told. Finish it even if you start to feel better.  Only take over-the-counter or prescription medicines for pain, discomfort, or fever as told by your doctor.  Follow up with your doctor as told. Contact a doctor if:  You have otitis media only in one ear, or bleeding from your nose, or both.  You notice a lump on your neck.  You are not getting better in 3-5 days.  You feel worse instead of better. Get help right away if:  You have pain that is not helped with medicine.  You have puffiness, redness, or pain around your ear.  You get a stiff neck.  You cannot move part of your face (paralysis).  You notice that the bone behind your ear hurts when you touch it. This information is not intended to replace advice given to you by your health care provider. Make sure you discuss any questions you have with your health care provider. Document Released: 06/26/2007 Document Revised: 06/15/2015 Document Reviewed: 08/04/2012 Elsevier Interactive Patient Education  2017 [employer]

## 2016-06-21 NOTE — Assessment & Plan Note (Signed)
New onset ear rubbing and congestion with symptoms consistent with otitis media. Ear exam difficult to perform secondary to patient cooperation. Start amoxicillin. Follow up if symptoms worsen or do not improve.

## 2016-10-05 ENCOUNTER — Encounter: Payer: Self-pay | Admitting: Family

## 2016-10-08 ENCOUNTER — Telehealth: Payer: Self-pay

## 2016-10-08 NOTE — Telephone Encounter (Signed)
Per pt's father Dr. Carver Fila is leaving [facility]. They would like to know if you are/would accept him as a new patient. He is special needs and they would like to have a provider with that experience/knowledge.   Dr. Fabian Sharp - Please advise. Thanks!

## 2016-10-10 NOTE — Telephone Encounter (Signed)
Ok  First visit should be 45 minutes .

## 2016-10-10 NOTE — Telephone Encounter (Signed)
Patient scheduled to see Dr Fabian Sharp. Nothing further needed at this time.

## 2016-11-07 NOTE — Progress Notes (Addendum)
Chief Complaint  Patient presents with  . Establish Care    Transfer from Dr Carver Fila.     HPI: Patient  Nathaniel Jimenez  26 y.o. comes in today for  New patient    "transfer"  visit  With father   Nathaniel Jimenez has left practice .  He has dx downs and autistic behavior   Born in  [place] c section    Was noted to have AV canal and Down syndrome and had a temporary surgery banding at [place] about 61 months of age and then definitive surgery year later. Up until 2 years ago they have been living in [place] outside of [place]. Moved to the [place] area because of father's job as an Acupuncturist. Family mom father and has an adult brother sibling live in the household. He is basically full care but is easily directed likes to take walks in generally stable without medication or other medical problem.       No major problems  [facility] cardiology Dr Dareen Piano.would see him every few years. He is due for a follow-up cardiology check. Has not established with cardiology in [place] at this time..      Problematic issues; Gi :digestsion  The most problematic   Onset age 66   Certain foods. Bother him and he can vomitn     Chews towels    After eating and seems to help him .  X ray had barium swallow .    Had  Low peristalsis  And     Gallstone.    Noted .    Some meals  Gets vomiting  At a time s.  Not a lot of pain . Down time.    Seems ok with patterjs  Constipated mostly . He will use toilet when cued and taken by caretaker family. Otherwise there can be accidents.  Special shoes   Smooths Has AFOs    In shoes. Currently new ones aren't fitting right and get some rubbing spots and has some calluses.  Health Maintenance  Topic Date Due  . HIV Screening  12/28/2007  . INFLUENZA VACCINE  08/21/2016  . TETANUS/TDAP  11/09/2026   Health Maintenance Review LIFESTYLE:  Exercise:  acitivy  Walking   Age 21- 12 .   Sugar beverages:   No sodas   Not a lot of water  Some juices    .    Chewing  Issues  Yogurt  Dental grinds  :   Has some crowns.    Dr Allison Quarry     Near [place]. .  Vision  No problems over the year .   Hearing  Seem ok  Sleep:  Off an on    Erratic.   No osa  HH of    4  Son 21   No pets.  After [facility].        ROS:  GEN/ HEENT: No fever, significant weight changes sweats  vision problems hearing changes,? Has had eye check in past   Grits teeth has dentist  CV/ PULM; No chest pain shortness of breath cough, syncope,edema  change in exercise tolerance. GI /GU: No adominal pain,No blood in the stool. No significant GU symptoms. SKIN/HEME: ,no acute skin rashes suspicious lesions or bleeding. No lymphadenopathy, nodules, masses.  NEURO/ PSYCH:  Independent gait    n  IMM/ Allergy: No unusual infections.  Allergy .   REST of 12 system review negative except as  per HPI   Past Medical History:  Diagnosis Date  . Autism   . Autism   . Down syndrome   . Down syndrome   . Heart murmur   . Heart murmur     Past Surgical History:  Procedure Laterality Date  . CARDIAC SURGERY     x2 - One at 3 months and one at 6715 months    Family History  Problem Relation Age of Onset  . Healthy Mother   . Healthy Father   . Cancer Maternal Grandmother   . Bladder Cancer Maternal Grandfather   . Healthy Paternal Grandmother   . Healthy Paternal Grandfather     Social History   Socioeconomic History  . Marital status: Single    Spouse name: None  . Number of children: 0  . Years of education: None  . Highest education level: None  Social Needs  . Financial resource strain: None  . Food insecurity - worry: None  . Food insecurity - inability: None  . Transportation needs - medical: None  . Transportation needs - non-medical: None  Occupational History  . Occupation: unemployed   Tobacco Use  . Smoking status: Never Smoker  . Smokeless tobacco: Never Used  Substance and Sexual Activity  . Alcohol use: No    Alcohol/week: 0.0 oz  . Drug use:  No  . Sexual activity: None  Other Topics Concern  . None  Social History Narrative   Lives with parents and is non-verbal, autistic and has down syndrome.    Born in [place] had heart surgery yet L lived in [place] before [place] with [place]. For fathers job Acupuncturistelectrical engineer.    Is in [facility].       Outpatient Medications Prior to Visit  Medication Sig Dispense Refill  . amoxicillin (AMOXIL) 500 MG tablet Take 1 tablet (500 mg total) by mouth 2 (two) times daily. (Patient not taking: Reported on 11/30/2016) 14 tablet 0   No facility-administered medications prior to visit.      EXAM:  BP 102/60 (BP Location: Left Arm, Patient Position: Sitting, Cuff Size: Normal)   Pulse 86   Temp (!) 97.3 F (36.3 C) (Oral)   Ht 4\' 11"  (1.499 m)   Wt 97 lb 11.2 oz (44.3 kg)   BMI 19.73 kg/m   Body mass index is 19.73 kg/m. Wt Readings from Last 3 Encounters:  11/30/16 103 lb (46.7 kg)  11/08/16 97 lb 11.2 oz (44.3 kg)  06/21/16 101 lb 12.8 oz (46.2 kg)    Physical Exam: Vital signs reviewed ZOX:WRUEGEN:This is a well-developed well-nourished somewhat cooperative will follow direction with upper extremity almost choreiform type  movements. He has a Down's face sees but a large underwriting mandible. He does teeth grinding. This is audible. HEENT: mc  atraumatic , Eyes: PERRL EOM's full, conjunctiva clear?, Nares: paten,t no deformity discharge or tenderness., Ears: no deformity EAC's clear TMs with normal landmarks.  Limited  Exam but no wax impaxted  Mouth: not examined   Large  underbite   Moist mucous membranes.  NECK: supple  Short without masses, thyromegaly or bruits. CHEST/PULM:  Clear to auscultation and percussion breath sounds equal no wheeze , rales or rhonchi. No chest wall deformities or tenderness. Well healed surg scar chest  CV: PMI is nondisplaced, S1 S2 no gallops, murmurs, rubs. Peripheral pulses are full without  delay.No JVD .faint 1-2/6syst m at apex diastole clear ABDOMEN: Bowel sounds normal  nontender  No guard or rebound, no hepato splenomegal no CVA tenderness.  No obvious hernia limited exam  Extremtities:  No clubbing cyanosis or edema, no acute joint swelling or redness no focal atrophy foreshortened fingers consistent with Down's callus bottom of the feet with pain is planus. NEURO:   Some dec m tone but independent gait , no obvious focal weakness, non verbal;  But alert SKIN: No acute rashes normal turgor, color, no bruising or petechiae. Dusky nails but no clubbing  LN: no cervical  adenopathy  Lab Results  Component Value Date   WBC 5.4 11/08/2016   HGB 15.5 11/08/2016   HCT 45.3 11/08/2016   PLT 191.0 11/08/2016   GLUCOSE 91 11/08/2016   CHOL 136 11/08/2016   TRIG 77.0 11/08/2016   HDL 37.80 (L) 11/08/2016   LDLCALC 83 11/08/2016   ALT 23 11/08/2016   AST 21 11/08/2016   NA 140 11/08/2016   K 3.7 11/08/2016   CL 99 11/08/2016   CREATININE 1.04 11/08/2016   BUN 17 11/08/2016   CO2 35 (H) 11/08/2016   TSH 1.64 11/08/2016    BP Readings from Last 3 Encounters:  11/08/16 102/60  06/21/16 102/60  02/09/16 100/68   Wt Readings from Last 3 Encounters:  11/30/16 103 lb (46.7 kg)  11/08/16 97 lb 11.2 oz (44.3 kg)  06/21/16 101 lb 12.8 oz (46.2 kg)    ASSESSMENT AND PLAN:  Discussed the following assessment and plan:  Down syndrome - Plan: TSH, T4, free, Celiac Disease Comprehensive Panel with Reflexes, CBC with Differential/Platelet, Lipid panel, Comprehensive metabolic panel, Ambulatory referral to Cardiology  Autism - Plan: TSH, T4, free, Celiac Disease Comprehensive Panel with Reflexes, CBC with Differential/Platelet, Lipid panel, Comprehensive metabolic panel  GI symptoms - Plan: TSH, T4, free, Celiac Disease Comprehensive Panel with Reflexes, CBC with Differential/Platelet, Lipid panel, Comprehensive metabolic panel  Disorder of peristalsis of esophagus - Plan:  TSH, T4, free, Celiac Disease Comprehensive Panel with Reflexes, CBC with Differential/Platelet, Lipid panel, Comprehensive metabolic panel  Congenital heart disease s/p repair avcanal - Plan: TSH, T4, free, Celiac Disease Comprehensive Panel with Reflexes, CBC with Differential/Platelet, Lipid panel, Comprehensive metabolic panel, Ambulatory referral to Cardiology  Gall stones - Plan: CBC with Differential/Platelet, Lipid panel, Comprehensive metabolic panel  Constipation, unspecified constipation type  Need for Tdap vaccination  SBE (subacute bacterial endocarditis) prophylaxis candidate - takes amox per procedure  Mitral valve disorder - Plan: Ambulatory referral to Cardiology Monitoring high risk downs conditions Probably   Peristalsis .  Motility problem  Constipated .  Parents and family adapted well. It is possible some of his behaviors is from his poor motility and possible reflux but no acute findings. Reported that he does have gallstones and episodic vomiting but no apparent painful episodes. Check thyroid CBC blood work today celiac which is increased and downs. But his is probably motility problem that would just need to be managed. Congenital heart disease status post surgery I have no records he seems to be thriving fine murmur heard on exam father will get the cardiovascular records the last couple years and any reports and then we can do referral for cardiology. Let us know in the meantime if there is a problem. Total visit > 50% spent counseling and coordinating care as indicated in above note and in instructions to patient .    Patient Care Team: Real Cona, Neta Mends, MD as PCP - General (Internal Medicine) Patient Instructions  Labs as discussed .  Adding  Thyroid tests.    Get records from cardiologist last   Years   And we can get      Referral to cardiology     Ongoing.  monitoring. Update   attempt to get enough calcium for bone health.   tdap today   Advise  flu vaccine  This season.   Plan yearly follow up depending on how doing .        Neta Mends. Mauriah Mcmillen M.D. records received last echo and eval cards may 2015 echo noted stable with mild to mod mr mitral valve cleft and  Mild subaortic stenosis no sx   No sig change from previous    No restrictions  But sbe eval and advised recheck in 2 years   Thus is over due for cards  Follow up .  Form completed no restrictions  Will advise cards  Referral as planned

## 2016-11-08 ENCOUNTER — Ambulatory Visit (INDEPENDENT_AMBULATORY_CARE_PROVIDER_SITE_OTHER): Payer: 59 | Admitting: Internal Medicine

## 2016-11-08 ENCOUNTER — Encounter: Payer: Self-pay | Admitting: Internal Medicine

## 2016-11-08 VITALS — BP 102/60 | HR 86 | Temp 97.3°F | Ht 59.0 in | Wt 97.7 lb

## 2016-11-08 DIAGNOSIS — K5909 Other constipation: Secondary | ICD-10-CM | POA: Insufficient documentation

## 2016-11-08 DIAGNOSIS — K224 Dyskinesia of esophagus: Secondary | ICD-10-CM

## 2016-11-08 DIAGNOSIS — R198 Other specified symptoms and signs involving the digestive system and abdomen: Secondary | ICD-10-CM | POA: Diagnosis not present

## 2016-11-08 DIAGNOSIS — K8020 Calculus of gallbladder without cholecystitis without obstruction: Secondary | ICD-10-CM | POA: Diagnosis not present

## 2016-11-08 DIAGNOSIS — Q249 Congenital malformation of heart, unspecified: Secondary | ICD-10-CM | POA: Diagnosis not present

## 2016-11-08 DIAGNOSIS — Z298 Encounter for other specified prophylactic measures: Secondary | ICD-10-CM

## 2016-11-08 DIAGNOSIS — K5900 Constipation, unspecified: Secondary | ICD-10-CM

## 2016-11-08 DIAGNOSIS — F840 Autistic disorder: Secondary | ICD-10-CM | POA: Diagnosis not present

## 2016-11-08 DIAGNOSIS — Q909 Down syndrome, unspecified: Secondary | ICD-10-CM | POA: Diagnosis not present

## 2016-11-08 DIAGNOSIS — Z23 Encounter for immunization: Secondary | ICD-10-CM | POA: Diagnosis not present

## 2016-11-08 DIAGNOSIS — I059 Rheumatic mitral valve disease, unspecified: Secondary | ICD-10-CM

## 2016-11-08 LAB — LIPID PANEL
CHOLESTEROL: 136 mg/dL (ref 0–200)
HDL: 37.8 mg/dL — ABNORMAL LOW (ref >39.00)
LDL Cholesterol: 83 mg/dL (ref 0–99)
NonHDL: 98.6
TRIGLYCERIDES: 77 mg/dL (ref 0.0–149.0)
Total CHOL/HDL Ratio: 4
VLDL: 15.4 mg/dL (ref 0.0–40.0)

## 2016-11-08 LAB — CBC WITH DIFFERENTIAL/PLATELET
BASOS ABS: 0.1 10*3/uL (ref 0.0–0.1)
Basophils Relative: 1.8 % (ref 0.0–3.0)
Eosinophils Absolute: 0 10*3/uL (ref 0.0–0.7)
Eosinophils Relative: 0.6 % (ref 0.0–5.0)
HCT: 45.3 % (ref 39.0–52.0)
Hemoglobin: 15.5 g/dL (ref 13.0–17.0)
LYMPHS ABS: 1.2 10*3/uL (ref 0.7–4.0)
Lymphocytes Relative: 23 % (ref 12.0–46.0)
MCHC: 34.3 g/dL (ref 30.0–36.0)
MCV: 99.9 fl (ref 78.0–100.0)
MONO ABS: 0.3 10*3/uL (ref 0.1–1.0)
Monocytes Relative: 6.1 % (ref 3.0–12.0)
NEUTROS PCT: 68.5 % (ref 43.0–77.0)
Neutro Abs: 3.7 10*3/uL (ref 1.4–7.7)
Platelets: 191 10*3/uL (ref 150.0–400.0)
RBC: 4.53 Mil/uL (ref 4.22–5.81)
RDW: 13 % (ref 11.5–15.5)
WBC: 5.4 10*3/uL (ref 4.0–10.5)

## 2016-11-08 LAB — TSH: TSH: 1.64 u[IU]/mL (ref 0.35–4.50)

## 2016-11-08 LAB — COMPREHENSIVE METABOLIC PANEL
ALBUMIN: 4.3 g/dL (ref 3.5–5.2)
ALK PHOS: 58 U/L (ref 39–117)
ALT: 23 U/L (ref 0–53)
AST: 21 U/L (ref 0–37)
BUN: 17 mg/dL (ref 6–23)
CALCIUM: 9.1 mg/dL (ref 8.4–10.5)
CO2: 35 mEq/L — ABNORMAL HIGH (ref 19–32)
Chloride: 99 mEq/L (ref 96–112)
Creatinine, Ser: 1.04 mg/dL (ref 0.40–1.50)
GFR: 93.36 mL/min (ref >60.00)
Glucose, Bld: 91 mg/dL (ref 70–99)
POTASSIUM: 3.7 meq/L (ref 3.5–5.1)
Sodium: 140 mEq/L (ref 135–145)
TOTAL PROTEIN: 6.7 g/dL (ref 6.0–8.3)
Total Bilirubin: 1.6 mg/dL — ABNORMAL HIGH (ref 0.2–1.2)

## 2016-11-08 LAB — T4, FREE: FREE T4: 0.89 ng/dL (ref 0.60–1.60)

## 2016-11-08 NOTE — Patient Instructions (Addendum)
Labs as discussed .  Adding  Thyroid tests.    Get records from cardiologist last   Years   And we can get      Referral to cardiology     Ongoing.  monitoring. Update   attempt to get enough calcium for bone health.   tdap today   Advise flu vaccine  This season.   Plan yearly follow up depending on how doing .

## 2016-11-11 LAB — CELIAC DISEASE COMPREHENSIVE PANEL WITH REFLEXES
(tTG) Ab, IgA: 1 U/mL
Immunoglobulin A: 250 mg/dL (ref 81–463)

## 2016-11-30 ENCOUNTER — Emergency Department (HOSPITAL_COMMUNITY)
Admission: EM | Admit: 2016-11-30 | Discharge: 2016-11-30 | Disposition: A | Discharge status: Home / Self Care | Payer: 59 | Attending: Emergency Medicine | Admitting: Emergency Medicine

## 2016-11-30 ENCOUNTER — Emergency Department (HOSPITAL_COMMUNITY): Payer: 59

## 2016-11-30 ENCOUNTER — Encounter (HOSPITAL_COMMUNITY): Payer: Self-pay

## 2016-11-30 DIAGNOSIS — K228 Other specified diseases of esophagus: Secondary | ICD-10-CM | POA: Diagnosis not present

## 2016-11-30 DIAGNOSIS — R05 Cough: Secondary | ICD-10-CM | POA: Diagnosis not present

## 2016-11-30 DIAGNOSIS — R0989 Other specified symptoms and signs involving the circulatory and respiratory systems: Secondary | ICD-10-CM | POA: Diagnosis not present

## 2016-11-30 DIAGNOSIS — K224 Dyskinesia of esophagus: Secondary | ICD-10-CM | POA: Insufficient documentation

## 2016-11-30 NOTE — ED Triage Notes (Signed)
Pt is escorted with father who states that he had given his son an apple and seems to have some particles still stuck in throat and since has been foaming at the mouth and coughing. Pt appears to be trying to clear awy. Pt has HX of Downs syndrome, and autism. Pt is restless unable to obtain BP

## 2016-11-30 NOTE — ED Provider Notes (Signed)
[facility]-EMERGENCY DEPT Provider Note   CSN: [id number] Arrival date & time: 11/30/16  1219     History   Chief Complaint Chief Complaint  Patient presents with  . Airway Obstruction   Level 5 caveat: downsyndrome  HPI Nathaniel Jimenez is a 26 y.o. male.  HPI 26 year old male with a history of autism and Down syndrome who presents the emergency department this father after he developed coughing and spitting up secretions after lunch today.  The father is concerned that something might be stuck in his throat.  Patient is unable to provide any history given his history of Down syndrome.  He does have a history of esophageal dysmotility based on esophagram in February 2017.  He is seen [facility] before.  He has a history of gastroesophageal reflux disease.  No prior history of esophageal manipulation.  No history of esophageal strictures.   Past Medical History:  Diagnosis Date  . Autism   . Autism   . Down syndrome   . Down syndrome   . Heart murmur   . Heart murmur     Patient Active Problem List   Diagnosis Date Noted  . Disorder of peristalsis of esophagus 11/08/2016  . Gall stones 11/08/2016  . SBE (subacute bacterial endocarditis) prophylaxis candidate 11/08/2016  . Constipation 11/08/2016  . Right otitis media 06/21/2016  . Encounter for general adult medical examination with abnormal findings 02/09/2016  . Regurgitation of food 02/28/2015  . Vomiting 02/08/2015  . Down syndrome 02/08/2015  . Autism 02/08/2015  . Urinary incontinence 02/08/2015    Past Surgical History:  Procedure Laterality Date  . CARDIAC SURGERY     x2 - One at 3 months and one at 15 months       Home Medications    Prior to Admission medications   Medication Sig Start Date End Date Taking? Authorizing Provider  amoxicillin (AMOXIL) 500 MG tablet Take 1 tablet (500 mg total) by mouth 2 (two) times daily. Patient not taking: Reported on 11/30/2016 06/21/16    Veryl Speakalone, Gregory D, FNP    Family History Family History  Problem Relation Age of Onset  . Healthy Mother   . Healthy Father   . Cancer Maternal Grandmother   . Bladder Cancer Maternal Grandfather   . Healthy Paternal Grandmother   . Healthy Paternal Grandfather     Social History Social History   Tobacco Use  . Smoking status: Never Smoker  . Smokeless tobacco: Never Used  Substance Use Topics  . Alcohol use: No    Alcohol/week: 0.0 oz  . Drug use: No     Allergies   Patient has no known allergies.   Review of Systems Review of Systems  Unable to perform ROS: Other     Physical Exam Updated Vital Signs Pulse 77   Temp 97.8 F (36.6 C) (Oral)   Resp (!) 22   Ht 5' (1.524 m)   Wt 46.7 kg (103 lb)   SpO2 99%   BMI 20.12 kg/m   Physical Exam  Constitutional: He appears well-developed and well-nourished.  HENT:  Head: Normocephalic.  Posterior pharynx is normal.  No obvious obstruction.  Tongue is normal size.  Spitting secretions  Eyes: EOM are normal.  Neck: Normal range of motion. Neck supple. No tracheal deviation present. No thyromegaly present.  Cardiovascular: Normal rate.  Pulmonary/Chest: Effort normal and breath sounds normal. No stridor. No respiratory distress. He has no wheezes.  Abdominal: He exhibits no distension.  Musculoskeletal: Normal range of motion.  Neurological: He is alert.  Psychiatric: He has a normal mood and affect.  Nursing note and vitals reviewed.    ED Treatments / Results  Labs (all labs ordered are listed, but only abnormal results are displayed) Labs Reviewed - No data to display  EKG  EKG Interpretation None       Radiology No results found.  Procedures Procedures (including critical care time)  Medications Ordered in ED Medications - No data to display   Initial Impression / Assessment and Plan / ED Course  I have reviewed the triage vital signs and the nursing notes.  Pertinent labs & imaging  results that were available during my care of the patient were reviewed by me and considered in my medical decision making (see chart for details).     Patient symptoms resolved in the emergency department without intervention.  His chest x-ray is without abnormalities.  Questionable abnormality noted on plain film of the soft tissue neck however he is completely resolved at this time.  He does have a history of esophageal dysmotility and ice suspect this was an issue with motility given the fact that this occurred just after lunch when he ate a sandwich.  Tolerating secretions and fluids at this time.  Normal vital signs.  No indication for additional management or treatment acutely in the emergency department.  Family understands to return to the ER for worsening symptoms  Final Clinical Impressions(s) / ED Diagnoses   Final diagnoses:  Esophageal dysmotility    ED Discharge Orders    None       Azalia Bilisampos, Iness Pangilinan, MD 11/30/16 1510

## 2016-12-24 NOTE — Telephone Encounter (Signed)
error 

## 2017-01-06 ENCOUNTER — Encounter: Payer: Self-pay | Admitting: Internal Medicine

## 2017-01-07 ENCOUNTER — Telehealth: Payer: Self-pay | Admitting: Internal Medicine

## 2017-01-07 DIAGNOSIS — Q909 Down syndrome, unspecified: Secondary | ICD-10-CM

## 2017-01-07 NOTE — Telephone Encounter (Signed)
Father dropped of paperwork to be filled out for patient.  Call [phone] upon completion.  Paperwork placed in dr's folder.

## 2017-01-08 NOTE — Telephone Encounter (Signed)
Will send to Dr Fabian SharpPanosh to advise on record review and also completion of form.  Form will be given to you when you return to office.

## 2017-01-08 NOTE — Telephone Encounter (Signed)
Form to be given to Dr Fabian SharpPanosh when she returns on 01/13/17. (see MyChart Message -- this message can be closed)

## 2017-01-15 NOTE — Telephone Encounter (Signed)
Left a VM for patient regarding forms being ready for pick up also left a message stating that patient is due for a cardiology f/u. Dr. Fabian SharpPanosh has placed a referral for patient.

## 2017-01-15 NOTE — Addendum Note (Signed)
Addended byBerniece Andreas: PANOSH, WANDA K on: 01/15/2017 09:21 AM   Modules accepted: Orders

## 2017-01-15 NOTE — Telephone Encounter (Signed)
Completed my part of form  Reviewed records   See addendeun He is due for fu cardiology advised  In 2 years from 5 15  Placed a cards referral  For  Fu will end up needing  FU  echo and eval .  Records sent to scan.

## 2017-01-15 NOTE — Telephone Encounter (Signed)
I completed the form  And reviewed records  And send referral for cardiology  I think I already documented this on a another thread please let dad know

## 2017-01-27 ENCOUNTER — Telehealth: Payer: Self-pay | Admitting: Internal Medicine

## 2017-01-27 NOTE — Telephone Encounter (Signed)
Patients father picked up form and paid $20 form fee

## 2017-02-17 ENCOUNTER — Encounter: Payer: Self-pay | Admitting: Family Medicine

## 2017-02-17 ENCOUNTER — Ambulatory Visit (INDEPENDENT_AMBULATORY_CARE_PROVIDER_SITE_OTHER): Payer: 59 | Admitting: Family Medicine

## 2017-02-17 VITALS — BP 110/70 | HR 56 | Temp 97.9°F | Wt 99.2 lb

## 2017-02-17 DIAGNOSIS — R319 Hematuria, unspecified: Secondary | ICD-10-CM

## 2017-02-17 DIAGNOSIS — Q909 Down syndrome, unspecified: Secondary | ICD-10-CM

## 2017-02-17 DIAGNOSIS — R310 Gross hematuria: Secondary | ICD-10-CM

## 2017-02-17 LAB — POCT URINALYSIS DIPSTICK
Bilirubin, UA: NEGATIVE
Glucose, UA: NEGATIVE
KETONES UA: NEGATIVE
Nitrite, UA: NEGATIVE
PH UA: 5 (ref 5.0–8.0)
Protein, UA: NEGATIVE
SPEC GRAV UA: 1.015 (ref 1.010–1.025)
UROBILINOGEN UA: 1 U/dL

## 2017-02-17 NOTE — Progress Notes (Signed)
Subjective:     Patient ID: Nathaniel Jimenez, male   DOB: 08/10/1990, 27 y.o.   MRN: [id number]  HPI Patient has Down syndrome. He is brought in by father with 2 day history of couple episodes of gross hematuria. Never had this before. No history of kidney stones. He's not seemed to be in any pain or discomfort. No fever. Appetite seems to be okay. No nausea or vomiting. He has some chronic frequent constipation which is unchanged. No other bleeding concerns. No easy bruising. He is mostly continent but occasionally incontinent of urine at baseline. Has not complained any of recent burning with urination. No reported history of UTI.  No easy bruising, epistaxis, or other bleeding concerns.  Past Medical History:  Diagnosis Date  . Autism   . Autism   . Down syndrome   . Down syndrome   . Heart murmur   . Heart murmur    Past Surgical History:  Procedure Laterality Date  . CARDIAC SURGERY     x2 - One at 3 months and one at 15 months    reports that  has never smoked. he has never used smokeless tobacco. He reports that he does not drink alcohol or use drugs. family history includes Bladder Cancer in his maternal grandfather; Cancer in his maternal grandmother; Healthy in his father, mother, paternal grandfather, and paternal grandmother. No Known Allergies   Review of Systems  Constitutional: Negative for chills and fever.  Genitourinary: Positive for hematuria. Negative for flank pain.   Limited hx available from patient.     Objective:   Physical Exam  Constitutional: He appears well-developed and well-nourished.  Cardiovascular: Normal rate and regular rhythm.  Pulmonary/Chest: Effort normal and breath sounds normal. No respiratory distress. He has no wheezes. He has no rales.  Abdominal: Soft. There is no tenderness.  Musculoskeletal: He exhibits no edema.  Neurological: He is alert.       Assessment:     Patient with Down syndrome presents with 2 day history of gross  hematuria on a couple occasions.  No apparent discomfort.  Urine dipstick reveals only trace leukocytes and large hemoglobin otherwise negative. Etiology unclear. Doubt infection. He has not any fever or symptoms (though history limited)    Plan:     -Send urine culture -Follow-up immediately for any fever or worsening symptoms -If culture negative, consider urology referral for further workup  Kristian CoveyBruce W Burchette MD [facility]  Spoke with father tonight and pt has not had any repeat episodes of gross hematuria.  No fever.    Kristian CoveyBruce W Burchette MD [facility]]

## 2017-02-17 NOTE — Patient Instructions (Signed)
We are checking urine culture Follow up immediately for any fever or other change of symptoms.

## 2017-02-18 LAB — URINE CULTURE
MICRO NUMBER: 90116231
SPECIMEN QUALITY: ADEQUATE

## 2017-02-19 ENCOUNTER — Other Ambulatory Visit: Payer: Self-pay | Admitting: Family Medicine

## 2017-02-19 DIAGNOSIS — R319 Hematuria, unspecified: Secondary | ICD-10-CM

## 2017-02-21 ENCOUNTER — Other Ambulatory Visit (INDEPENDENT_AMBULATORY_CARE_PROVIDER_SITE_OTHER): Payer: 59

## 2017-02-21 DIAGNOSIS — R319 Hematuria, unspecified: Secondary | ICD-10-CM | POA: Diagnosis not present

## 2017-02-21 LAB — CBC WITH DIFFERENTIAL/PLATELET
Basophils Absolute: 0.1 10*3/uL (ref 0.0–0.1)
Basophils Relative: 1.2 % (ref 0.0–3.0)
EOS ABS: 0 10*3/uL (ref 0.0–0.7)
Eosinophils Relative: 1 % (ref 0.0–5.0)
HCT: 44.6 % (ref 39.0–52.0)
HEMOGLOBIN: 15.5 g/dL (ref 13.0–17.0)
LYMPHS PCT: 32.7 % (ref 12.0–46.0)
Lymphs Abs: 1.6 10*3/uL (ref 0.7–4.0)
MCHC: 34.8 g/dL (ref 30.0–36.0)
MCV: 98.2 fl (ref 78.0–100.0)
MONO ABS: 0.4 10*3/uL (ref 0.1–1.0)
Monocytes Relative: 8.6 % (ref 3.0–12.0)
Neutro Abs: 2.7 10*3/uL (ref 1.4–7.7)
Neutrophils Relative %: 56.5 % (ref 43.0–77.0)
Platelets: 225 10*3/uL (ref 150.0–400.0)
RBC: 4.54 Mil/uL (ref 4.22–5.81)
RDW: 12.8 % (ref 11.5–15.5)
WBC: 4.8 10*3/uL (ref 4.0–10.5)

## 2017-02-21 LAB — BASIC METABOLIC PANEL
BUN: 18 mg/dL (ref 6–23)
CHLORIDE: 103 meq/L (ref 96–112)
CO2: 30 meq/L (ref 19–32)
Calcium: 8.9 mg/dL (ref 8.4–10.5)
Creatinine, Ser: 1.11 mg/dL (ref 0.40–1.50)
GFR: 86.39 mL/min (ref >60.00)
Glucose, Bld: 64 mg/dL — ABNORMAL LOW (ref 70–99)
POTASSIUM: 4.4 meq/L (ref 3.5–5.1)
SODIUM: 141 meq/L (ref 135–145)

## 2017-03-12 ENCOUNTER — Telehealth: Payer: Self-pay | Admitting: Cardiovascular Disease

## 2017-03-12 ENCOUNTER — Encounter: Payer: Self-pay | Admitting: Cardiovascular Disease

## 2017-03-12 ENCOUNTER — Ambulatory Visit (INDEPENDENT_AMBULATORY_CARE_PROVIDER_SITE_OTHER): Payer: 59 | Admitting: Cardiovascular Disease

## 2017-03-12 DIAGNOSIS — Q249 Congenital malformation of heart, unspecified: Secondary | ICD-10-CM | POA: Insufficient documentation

## 2017-03-12 NOTE — Telephone Encounter (Signed)
New message    [facility] Meredeth Ide[facility]leming office, [facility], requesting referral be faxed [phone]. Referral in Epic can not be viewed. Please cal Marchelle Folksmanda at [phone]

## 2017-03-12 NOTE — Progress Notes (Signed)
03/12/2017 Nathaniel Leatherwoodobert Gritton   06/05/1990  [id number]  Primary Physician Panosh, Neta MendsWanda K, MD Primary Cardiologist: Runell GessJonathan J Nikolaos Maddocks MD Nicholes CalamityFACP, FACC, FAHA, [place]  HPI:  Nathaniel LeatherwoodRobert Jimenez is a 27 y.o. single Caucasian male with a history of Down syndrome referred by Dr. Fabian SharpPanosh for ongoing cardiac vascular care. He is accompanied by his father Theron Aristaeter. He apparently had congenital heart surgery at age 753 months old and 7315 months old at [facility] and has [facility] since that time. His last 2-D echo performed 05/21/13 was at [facility] revealing complete common AV valve canal repair with moderate regurgitation. He is totally asymptomatic.   No outpatient medications have been marked as taking for the 03/12/17 encounter (Office Visit) with Runell GessBerry, Myangel Summons J, MD.     No Known Allergies  Social History   Socioeconomic History  . Marital status: Single    Spouse name: Not on file  . Number of children: 0  . Years of education: Not on file  . Highest education level: Not on file  Social Needs  . Financial resource strain: Not on file  . Food insecurity - worry: Not on file  . Food insecurity - inability: Not on file  . Transportation needs - medical: Not on file  . Transportation needs - non-medical: Not on file  Occupational History  . Occupation: unemployed   Tobacco Use  . Smoking status: Never Smoker  . Smokeless tobacco: Never Used  Substance and Sexual Activity  . Alcohol use: No    Alcohol/week: 0.0 oz  . Drug use: No  . Sexual activity: Not on file  Other Topics Concern  . Not on file  Social History Narrative   Lives with parents and is non-verbal, autistic and has down syndrome.    [place] in [place] L lived in [place]. For fathers job Acupuncturistelectrical engineer.    Is in [facility].        Review of Systems: General: negative for  chills, fever, night sweats or weight changes.  Cardiovascular: negative for chest pain, dyspnea on exertion, edema, orthopnea, palpitations, paroxysmal nocturnal dyspnea or shortness of breath Dermatological: negative for rash Respiratory: negative for cough or wheezing Urologic: negative for hematuria Abdominal: negative for nausea, vomiting, diarrhea, bright red blood per rectum, melena, or hematemesis Neurologic: negative for visual changes, syncope, or dizziness All other systems reviewed and are otherwise negative except as noted above.    Blood pressure (!) 96/57, pulse 63, height 5' (1.524 m), weight 99 lb 3.2 oz (45 kg).  General appearance: alert and no distress Neck: no adenopathy, no carotid bruit, no JVD, supple, symmetrical, trachea midline and thyroid not enlarged, symmetric, no tenderness/mass/nodules Lungs: clear to auscultation bilaterally Heart: regular rate and rhythm, S1, S2 normal, no murmur, click, rub or gallop Extremities: extremities normal, atraumatic, no cyanosis or edema Pulses: 2+ and symmetric Skin: Skin color, texture, turgor normal. No rashes or lesions Neurologic: Alert and oriented X 3, normal strength and tone. Normal symmetric reflexes. Normal coordination and gait  EKG sinus rhythm at 63 with right bundle branch block and left axis deviation. I personally reviewed this EKG.  ASSESSMENT AND PLAN:   Adult congenital heart disease Nathaniel MaduroRobert is a 27 year old Down's syndrome survivor had congenital heart surgery at [facility] at age 73 months in 15 months. His last 2-D echocardiogram was performed at  [facility] 05/21/13 which was notable for complete common AV valve canal repair of a cleft mitral valve and mild to moderate residual left AV valve regurgitation. 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  Brigido is completely asymptomatic. I'm going to refer him to Dr. Theodis Sato, adult congenital cardiologist from [facility] for ongoing care.      Runell Gess MD FACP,FACC,FAHA, [facility] 03/12/2017 10:06 AM

## 2017-03-12 NOTE — Telephone Encounter (Signed)
Faxed referral and ov note to number provided.

## 2017-03-12 NOTE — Patient Instructions (Signed)
Medication Instructions: Your physician recommends that you continue on your current medications as directed. Please refer to the Current Medication list given to you today.   Follow-Up: You have been referred to Dr. Cristy FolksGregory Fleming at   [facility] [address]  [place], [place] [address]  [phone]

## 2017-03-12 NOTE — Assessment & Plan Note (Signed)
Molly MaduroRobert is a 27 year old Down's syndrome survivor had congenital heart surgery at [facility] at age 25 months in 15 months. His [facility]D [facility] which was notable for complete common AV valve canal repair of a cleft mitral valve and mild to moderate residual left AV valve regurgitation. 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 Molly MaduroRobert is completely asymptomatic. I'm going to refer him to Dr. Theodis SatoGreg Fleming, adult congenital cardiologist from [facility] for ongoing care.

## 2017-03-27 IMAGING — RF DG ESOPHAGUS
13 series · 13 of 13 positions shown · non-contrast
Comparison: None.

CLINICAL DATA: Chronic gastroesophageal reflux disease. Vomiting
after meals.

EXAM:
ESOPHOGRAM/BARIUM SWALLOW
TECHNIQUE: Single contrast examination was performed using thin barium. The
patient was given a 13 mm barium tablet but it was chewed into
particles.
FLUOROSCOPY TIME:  Fluoroscopy Time:  1 minutes 59 seconds

[Series 1: run · 1 of 1 slices shown (1 of 13)]
[im 1/1]
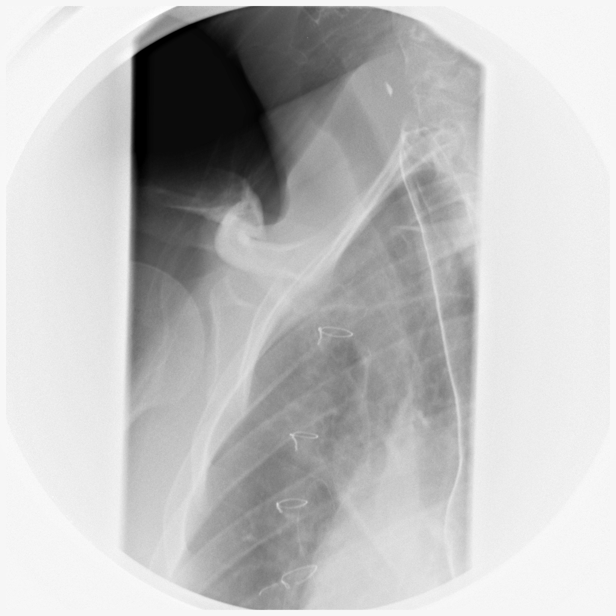

[Series 2: run · 1 of 1 slices shown (2 of 13)]
[im 1/1]
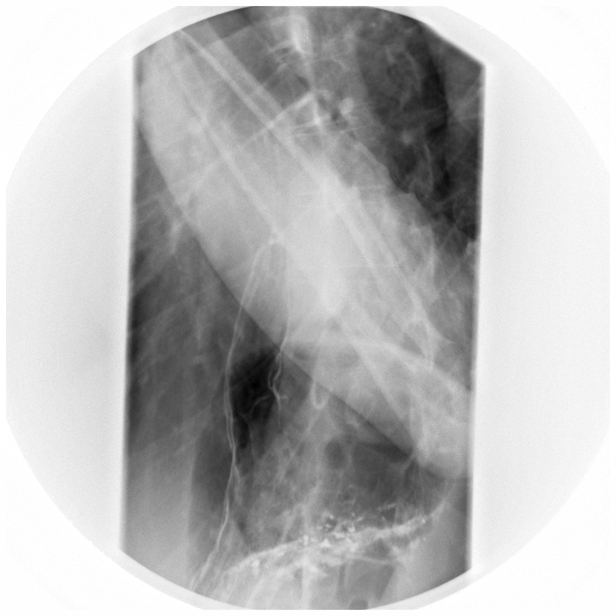

[Series 3: run · 1 of 1 slices shown (3 of 13)]
[im 1/1]
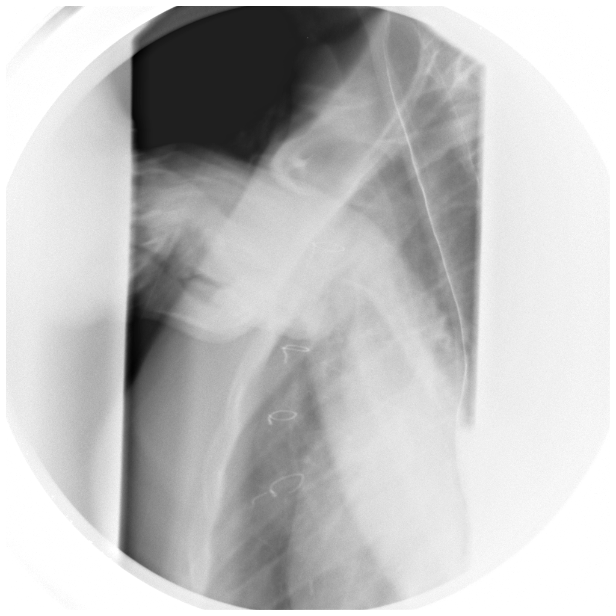

[Series 4: run · 1 of 1 slices shown (4 of 13)]
[im 1/1]
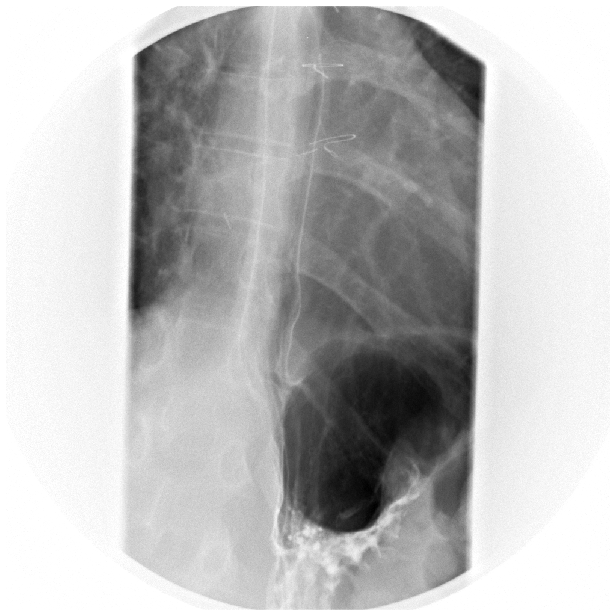

[Series 5: run · 1 of 1 slices shown (5 of 13)]
[im 1/1]
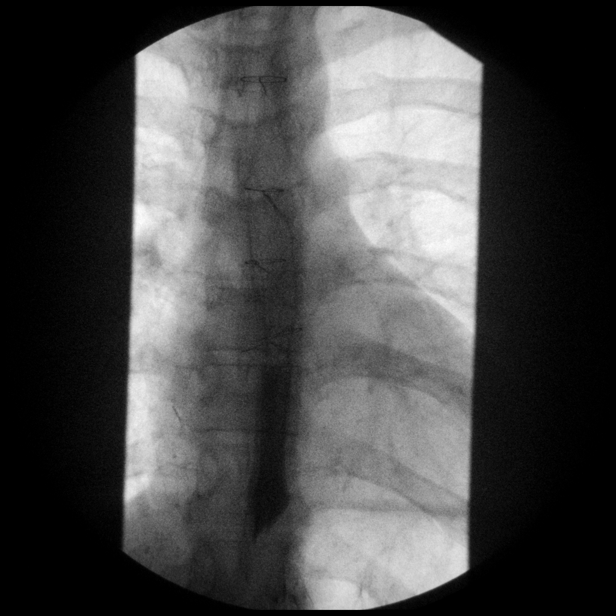

[Series 6: run · 1 of 1 slices shown (6 of 13)]
[im 1/1]
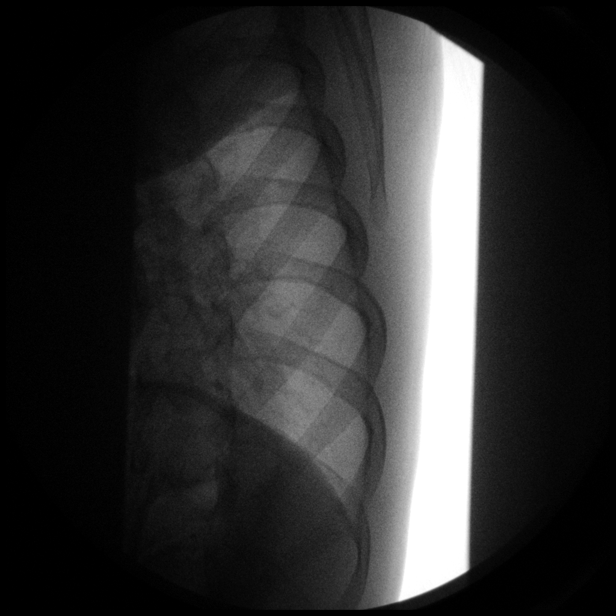

[Series 7: run · 1 of 1 slices shown (7 of 13)]
[im 1/1]
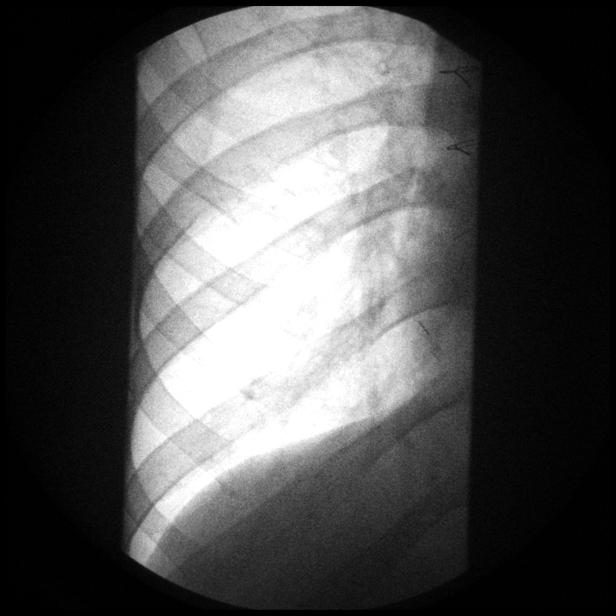

[Series 8: run · 1 of 1 slices shown (8 of 13)]
[im 1/1]
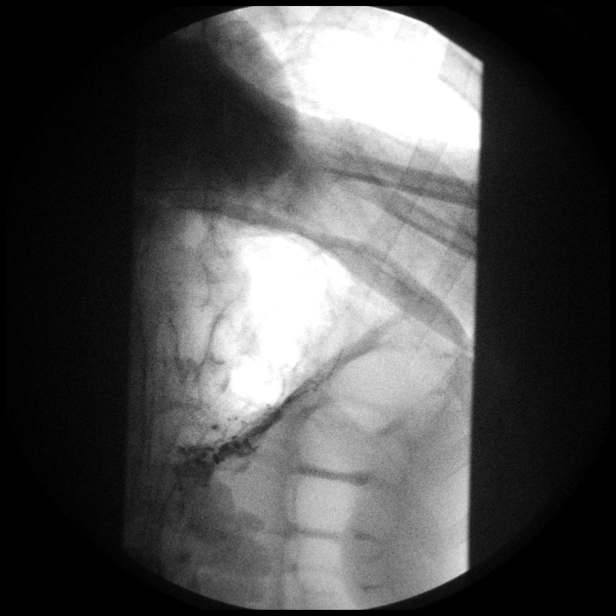

[Series 9: run · 1 of 1 slices shown (9 of 13)]
[im 1/1]
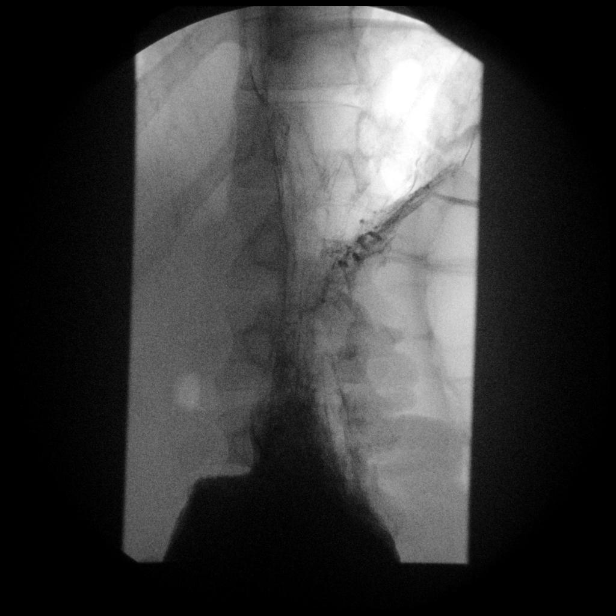

[Series 10: run · 1 of 1 slices shown (10 of 13)]
[im 1/1]
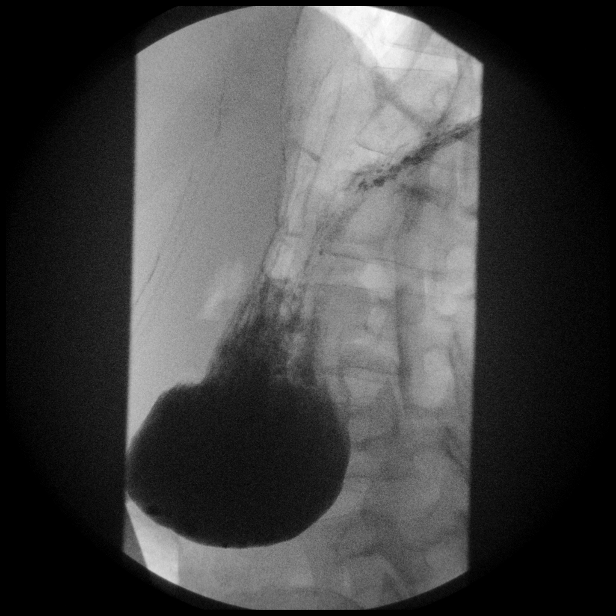

[Series 11: run · 1 of 1 slices shown (11 of 13)]
[im 1/1]
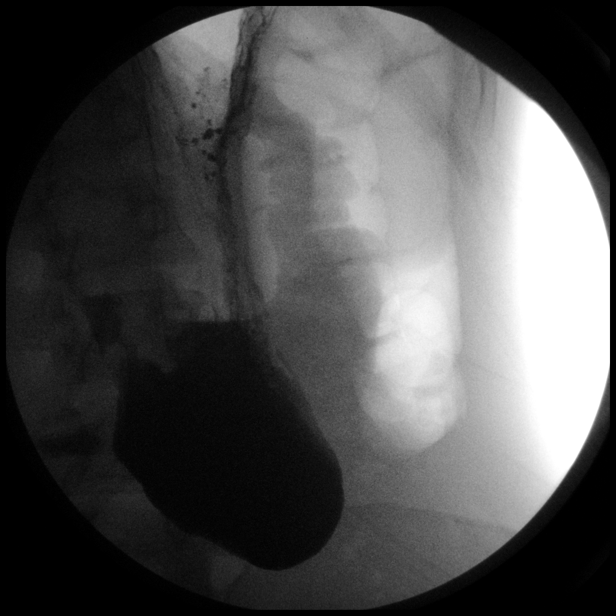

[Series 12: run · 1 of 1 slices shown (12 of 13)]
[im 1/1]
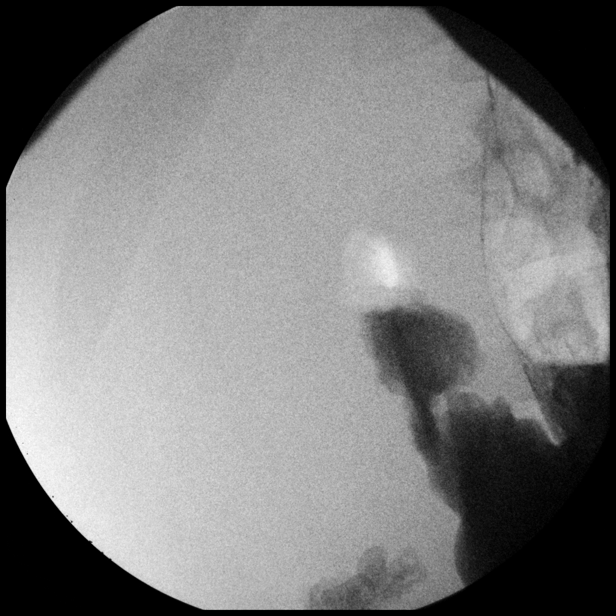

[Series 13: run · 1 of 1 slices shown (13 of 13)]
[im 1/1]
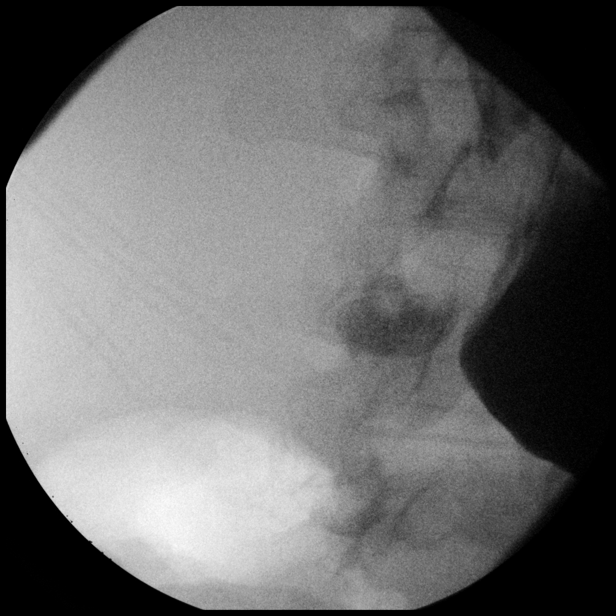

[13 of 13 positions shown; findings below may reference images not displayed]

FINDINGS: The mucosa of the esophagus appears normal. Minimal peristalsis in
the esophagus. The gastroesophageal junction is patulous. No visible
hiatal hernia.

Note is made of multiple gallstones.
IMPRESSION: 1. Patulous gastroesophageal junction. Minimal appreciable
peristalsis in the otherwise normal appearing esophagus.
2. Multiple gallstones.
3. [place] was unable to swallow the barium tablet whole.

## 2017-10-13 DIAGNOSIS — R32 Unspecified urinary incontinence: Secondary | ICD-10-CM | POA: Diagnosis not present

## 2017-10-14 DIAGNOSIS — R32 Unspecified urinary incontinence: Secondary | ICD-10-CM | POA: Diagnosis not present

## 2017-10-16 DIAGNOSIS — R32 Unspecified urinary incontinence: Secondary | ICD-10-CM | POA: Diagnosis not present

## 2017-11-18 DIAGNOSIS — R32 Unspecified urinary incontinence: Secondary | ICD-10-CM | POA: Diagnosis not present

## 2017-11-28 DIAGNOSIS — R32 Unspecified urinary incontinence: Secondary | ICD-10-CM | POA: Diagnosis not present

## 2017-12-04 DIAGNOSIS — Q212 Atrioventricular septal defect: Secondary | ICD-10-CM | POA: Diagnosis not present

## 2017-12-04 DIAGNOSIS — Z8774 Personal history of (corrected) congenital malformations of heart and circulatory system: Secondary | ICD-10-CM | POA: Diagnosis not present

## 2017-12-24 NOTE — Progress Notes (Signed)
Chief Complaint  Patient presents with  . Annual Exam    Forms for Program / Smos ankle supports eval referral / Possible UTI    HPI: Patient  Nathaniel Jimenez  27 y.o. comes in today for Preventive Health Care visit  Here with father today  Hx of  has Down syndrome; Autism; Urinary incontinence; Regurgitation of food; Encounter for general adult medical examination with abnormal findings; Right otitis media; Disorder of peristalsis of esophagus; Gall stones; SBE (subacute bacterial endocarditis) prophylaxis candidate; Constipation; Adult congenital heart disease; Atrioventricular septal defect; Cleft leaflet, mitral valve; S/P atrioventricular septal defect repair; Subaortic stenosis; and History of gross hematuria on their problem list.    Has  forms to fill out  For [facility].   He attends yearly  Assessment   neds more help with getting correct  Orthotics   For  Foot  deformities pronation etc  Had smos but last one  Gotten did fit right and now is ok to get another ( yearly )  Disc seeing dr Paulla Dolly or the  Orthotics specialist   [facility] :     Level 4 orthotics .  [address]  Didn't work out.  No pain or ulcers   CV : Seen [facility] [place] and felt to be stable and to be seen yearly   Felt to be surprisingly  Stable    Hx of hematuria seen dr B referred tiourology 1 2019 but never got a contact   And since no   recurrence  Didn't get seem  wants to check no to be sure ok   Has Incontinence  Wears  Pull ups.   Has food intolerances but no tru allergy to them?   Hearing and vision  Ok eval   Dental gets care no  Dx of OSA by report .     Health Maintenance  Topic Date Due  . HIV Screening  12/28/2007  . INFLUENZA VACCINE  04/22/2018 (Originally 08/21/2017)  . TETANUS/TDAP  11/09/2026   Health Maintenance Review LIFESTYLE:  Exercise:  Swims  Tobacco/ETS:nn Alcohol: n Sugar beverages:no Sleep: 6  Maybe . Can hear and and see  Evaluated .  Drug use: no HH of     [facility]  [facility] of 4    ROS:  GEN/ HEENT: No fever, significant weight changes sweats headaches vision problems hearing changes, CV/ PULM; No chest pain shortness of breath cough, syncope,edema  change in exercise tolerance. GI /GU: No adominal pain, vomiting, change in bowel habits. No blood in the stool. No significant GU symptoms. SKIN/HEME: ,no acute skin rashes suspicious lesions or bleeding. No lymphadenopathy, nodules, masses.  NEURO/ PSYCH:  No neurologic signs such as weakness numbness. No depression anxiety. IMM/ Allergy: No unusual infections.  Allergy .   REST of 12 system review negative except as per HPI   Past Medical History:  Diagnosis Date  . Autism   . Autism   . Down syndrome   . Down syndrome   . Heart murmur   . Heart murmur   . Vomiting 02/08/2015    Past Surgical History:  Procedure Laterality Date  . CARDIAC SURGERY     x2 - One at 3 months and one at 27 months    Family History  Problem Relation Age of Onset  . Healthy Mother   . Healthy Father   . Cancer Maternal Grandmother   . Bladder Cancer Maternal Grandfather   . Healthy Paternal Grandmother   .  Healthy Paternal Grandfather     Social History   Socioeconomic History  . Marital status: Single    Spouse name: Not on file  . Number of children: 0  . Years of education: Not on file  . Highest education level: Not on file  Occupational History  . Occupation: unemployed   Social Needs  . Financial resource strain: Not on file  . Food insecurity:    Worry: Not on file    Inability: Not on file  . Transportation needs:    Medical: Not on file    Non-medical: Not on file  Tobacco Use  . Smoking status: Never Smoker  . Smokeless tobacco: Never Used  Substance and Sexual Activity  . Alcohol use: No    Alcohol/week: 0.0 standard drinks  . Drug use: No  . Sexual activity: Not on file  Lifestyle  . Physical activity:    Days per week: Not on file    Minutes per session: Not on  file  . Stress: Not on file  Relationships  . Social connections:    Talks on phone: Not on file    Gets together: Not on file    Attends religious service: Not on file    Active member of club or organization: Not on file    Attends meetings of clubs or organizations: Not on file    Relationship status: Not on file  Other Topics Concern  . Not on file  Social History Narrative   Lives with parents and is non-verbal, autistic and has down syndrome.    Born in [place] had heart surgery yet L lived in [place] of [place] before moving to [place] with family. For fathers job Art gallery manager.    Is in [facility].       No outpatient medications prior to visit.   No facility-administered medications prior to visit.      EXAM:  BP 104/68 (BP Location: Right Arm, Patient Position: Sitting, Cuff Size: Normal)   Ht 4' 8.89" (1.445 m)   Wt 101 lb 8 oz (46 kg)   BMI 22.05 kg/m   Body mass index is 22.05 kg/m. Wt Readings from Last 3 Encounters:  12/26/17 101 lb 8 oz (46 kg)  03/12/17 99 lb 3.2 oz (45 kg)  02/17/17 99 lb 3.2 oz (45 kg)    Physical Exam: Vital signs reviewed WHQ:PRFF is a well-developed well-nourished alert cooperative    who appearsr stated age in no acute distress.  Typical downs  Facies and  Attributes  No verbal father helps hold and direct   And cooperative  HEENT: normocephalic atraumatic , Eyes: PERRL EOM's full, conjunctiva clear, Nares: paten,t no deformity discharge or tenderness., Ears: y EAC's clear TMs with normal landmarks. Mouth: clear OP, not checked   prognathia .  Moist mucous membranes NECK: supple without masses, thyromegaly or bruits. CHEST/PULM:  Clear to auscultation and percussion breath sounds equal no wheeze , rales or rhonchi. Well healed  mid line sca. CV: PMI is quiet  S1 S2 no gallops, , rubs.  Murmur no gallop  Peripheral pulses are present  without delay.No JVD .  No cce  ABDOMEN: Bowel sounds  normal nontender  No guard or rebound, no hepato splenomegal no CVA tenderness.  No hernia noted . Extremtities:  No clubbing cyanosis or edema, no acute joint swelling or redness no focal atrophy  Downs  Hands  Feet  Sig pronation and laxity but independent steady gait  NEURO:  cranial nerves 3-12 appear to be intact, no obvious focal weakness,    SKIN: No acute rashes normal turgor, color, no bruising or petechiae. Facial erythema  No lesion   non verbal but cooperative  LN: no cervical axillary inguinal adenopathy GU no obv  Hernia examined laying down  Nl   Lab Results  Component Value Date   WBC 3.6 (L) 12/26/2017   HGB 16.2 12/26/2017   HCT 47.1 12/26/2017   PLT 187.0 12/26/2017   GLUCOSE 82 12/26/2017   CHOL 136 11/08/2016   TRIG 77.0 11/08/2016   HDL 37.80 (L) 11/08/2016   LDLCALC 83 11/08/2016   ALT 31 12/26/2017   AST 24 12/26/2017   NA 140 12/26/2017   K 3.9 12/26/2017   CL 100 12/26/2017   CREATININE 0.98 12/26/2017   BUN 16 12/26/2017   CO2 31 12/26/2017   TSH 2.13 12/26/2017    BP Readings from Last 3 Encounters:  12/26/17 104/68  03/12/17 (!) 96/57  02/17/17 110/70     ASSESSMENT AND PLAN:  Discussed the following assessment and plan:  Encounter for general adult medical examination with abnormal findings - Plan: Basic metabolic panel, CBC with Differential/Platelet, Hepatic function panel, TSH, T4, free  Adult congenital heart disease - Plan: Basic metabolic panel, CBC with Differential/Platelet, Hepatic function panel, TSH, T4, free, US Renal  Medication management  Dysuria - Plan: POC Urinalysis Dipstick, Basic metabolic panel, CBC with Differential/Platelet, Hepatic function panel, TSH, T4, free, US Renal  Down syndrome - Plan: Basic metabolic panel, CBC with Differential/Platelet, Hepatic function panel, TSH, T4, free, US Renal  Autism - Plan: Basic metabolic panel, CBC with Differential/Platelet, Hepatic function panel, TSH, T4, free  History  of gross hematuria - Plan: US Renal  S/P atrioventricular septal defect repair  Urinary incontinence, unspecified type  Congenital pes planovalgus - smos  Food intolerance - parents have a list   Has smo support  To get a new one  Disc  Fitting and options   He will proceed and if problematic consider  Checking with  Other  Has  forms  Food intolerance  t oget a list otherwise no resrtictions  To [place]  Forms completed and signed otherwise   See  Instructions get renal US and other monitoring   ua ok today  Reported to father usually get a work up for gross hematuria  Of unkown cause but  Doing well  Checking renal US at this time and observe  Should see uro of any recurrence of concern Patient Care Team: Burnis Medin, MD as PCP - General (Internal Medicine) Jonah Blue, MD as Referring Physician (Pediatric Cardiology) Patient Instructions  Check into   [facility]   .  Let us know if we need to look for other referral.   Urine is clear today  Checking lab .  Still have concerns about  Visible blood   And planning on  Renal ultrasound to check kidney  . You will be contacted about this .  Monitoring lab today .     Preventive Care 18-39 Years, Male Preventive care refers to lifestyle choices and visits with your health care provider that can promote health and wellness. What does preventive care include?  A yearly physical exam. This is also called an annual well check.  Dental exams once or twice a year.  Routine eye exams. Ask your health care provider how often you should have your eyes checked.  Personal lifestyle choices, including: ? Daily care of your teeth and gums. ? Regular physical activity. ? Eating a healthy diet. ? Avoiding tobacco and drug use. ? Limiting alcohol use. ? Practicing safe sex. What happens during an annual well check? The services and screenings done by your health care provider during your annual well check  will depend on your age, overall health, lifestyle risk factors, and family history of disease. Counseling Your health care provider may ask you questions about your:  Alcohol use.  Tobacco use.  Drug use.  Emotional well-being.  Home and relationship well-being.  Sexual activity.  Eating habits.  Work and work Statistician.  Screening You may have the following tests or measurements:  Height, weight, and BMI.  Blood pressure.  Lipid and cholesterol levels. These may be checked every 5 years starting at age 22.  Diabetes screening. This is done by checking your blood sugar (glucose) after you have not eaten for a while (fasting).  Skin check.  Hepatitis C blood test.  Hepatitis B blood test.  Sexually transmitted disease (STD) testing.  Discuss your test results, treatment options, and if necessary, the need for more tests with your health care provider. Vaccines Your health care provider may recommend certain vaccines, such as:  Influenza vaccine. This is recommended every year.  Tetanus, diphtheria, and acellular pertussis (Tdap, Td) vaccine. You may need a Td booster every 10 years.  Varicella vaccine. You may need this if you have not been vaccinated.  HPV vaccine. If you are 43 or younger, you may need three doses over 6 months.  Measles, mumps, and rubella (MMR) vaccine. You may need at least one dose of MMR.You may also need a second dose.  Pneumococcal 13-valent conjugate (PCV13) vaccine. You may need this if you have certain conditions and have not been vaccinated.  Pneumococcal polysaccharide (PPSV23) vaccine. You may need one or two doses if you smoke cigarettes or if you have certain conditions.  Meningococcal vaccine. One dose is recommended if you are age 39-21 years and a first-year college student living in a residence hall, or if you have one of several medical conditions. You may also need additional booster doses.  Hepatitis A vaccine. You  may need this if you have certain conditions or if you travel or work in places where you may be exposed to hepatitis A.  Hepatitis B vaccine. You may need this if you have certain conditions or if you travel or work in places where you may be exposed to hepatitis B.  Haemophilus influenzae type b (Hib) vaccine. You may need this if you have certain risk factors.  Talk to your health care provider about which screenings and vaccines you need and how often you need them. This information is not intended to replace advice given to you by your health care provider. Make sure you discuss any questions you have with your health care provider. Document Released: 03/05/2001 Document Revised: 09/27/2015 Document Reviewed: 11/08/2014 Elsevier Interactive Patient Education  2018 [place]. Josecarlos Harriott M.D.

## 2017-12-26 ENCOUNTER — Encounter: Payer: Self-pay | Admitting: Internal Medicine

## 2017-12-26 ENCOUNTER — Ambulatory Visit (INDEPENDENT_AMBULATORY_CARE_PROVIDER_SITE_OTHER): Payer: 59 | Admitting: Internal Medicine

## 2017-12-26 VITALS — BP 104/68 | Ht <= 58 in | Wt 101.5 lb

## 2017-12-26 DIAGNOSIS — R300 Dysuria: Secondary | ICD-10-CM | POA: Diagnosis not present

## 2017-12-26 DIAGNOSIS — Z8774 Personal history of (corrected) congenital malformations of heart and circulatory system: Secondary | ICD-10-CM

## 2017-12-26 DIAGNOSIS — Q909 Down syndrome, unspecified: Secondary | ICD-10-CM | POA: Diagnosis not present

## 2017-12-26 DIAGNOSIS — Z0001 Encounter for general adult medical examination with abnormal findings: Secondary | ICD-10-CM | POA: Diagnosis not present

## 2017-12-26 DIAGNOSIS — R32 Unspecified urinary incontinence: Secondary | ICD-10-CM

## 2017-12-26 DIAGNOSIS — Q249 Congenital malformation of heart, unspecified: Secondary | ICD-10-CM

## 2017-12-26 DIAGNOSIS — Q666 Other congenital valgus deformities of feet: Secondary | ICD-10-CM

## 2017-12-26 DIAGNOSIS — Z87448 Personal history of other diseases of urinary system: Secondary | ICD-10-CM

## 2017-12-26 DIAGNOSIS — Z79899 Other long term (current) drug therapy: Secondary | ICD-10-CM | POA: Diagnosis not present

## 2017-12-26 DIAGNOSIS — F840 Autistic disorder: Secondary | ICD-10-CM

## 2017-12-26 DIAGNOSIS — K9049 Malabsorption due to intolerance, not elsewhere classified: Secondary | ICD-10-CM

## 2017-12-26 DIAGNOSIS — Z87898 Personal history of other specified conditions: Secondary | ICD-10-CM

## 2017-12-26 LAB — POCT URINALYSIS DIPSTICK
Bilirubin, UA: NEGATIVE
Blood, UA: NEGATIVE
Glucose, UA: NEGATIVE
Ketones, UA: NEGATIVE
Leukocytes, UA: NEGATIVE
Nitrite, UA: NEGATIVE
Protein, UA: NEGATIVE
Spec Grav, UA: 1.01 (ref 1.010–1.025)
Urobilinogen, UA: 0.2 E.U./dL
pH, UA: 6 (ref 5.0–8.0)

## 2017-12-26 LAB — CBC WITH DIFFERENTIAL/PLATELET
Basophils Absolute: 0 10*3/uL (ref 0.0–0.1)
Basophils Relative: 0.9 % (ref 0.0–3.0)
Eosinophils Absolute: 0 10*3/uL (ref 0.0–0.7)
Eosinophils Relative: 1 % (ref 0.0–5.0)
HCT: 47.1 % (ref 39.0–52.0)
Hemoglobin: 16.2 g/dL (ref 13.0–17.0)
Lymphocytes Relative: 29.8 % (ref 12.0–46.0)
Lymphs Abs: 1.1 10*3/uL (ref 0.7–4.0)
MCHC: 34.5 g/dL (ref 30.0–36.0)
MCV: 100.7 fl — ABNORMAL HIGH (ref 78.0–100.0)
Monocytes Absolute: 0.4 10*3/uL (ref 0.1–1.0)
Monocytes Relative: 10.9 % (ref 3.0–12.0)
Neutro Abs: 2.1 10*3/uL (ref 1.4–7.7)
Neutrophils Relative %: 57.4 % (ref 43.0–77.0)
Platelets: 187 10*3/uL (ref 150.0–400.0)
RBC: 4.68 Mil/uL (ref 4.22–5.81)
RDW: 13.2 % (ref 11.5–15.5)
WBC: 3.6 10*3/uL — ABNORMAL LOW (ref 4.0–10.5)

## 2017-12-26 LAB — BASIC METABOLIC PANEL
BUN: 16 mg/dL (ref 6–23)
CO2: 31 mEq/L (ref 19–32)
Calcium: 9.1 mg/dL (ref 8.4–10.5)
Chloride: 100 mEq/L (ref 96–112)
Creatinine, Ser: 0.98 mg/dL (ref 0.40–1.50)
GFR: 99.05 mL/min (ref >60.00)
Glucose, Bld: 82 mg/dL (ref 70–99)
Potassium: 3.9 mEq/L (ref 3.5–5.1)
Sodium: 140 mEq/L (ref 135–145)

## 2017-12-26 LAB — HEPATIC FUNCTION PANEL
ALT: 31 U/L (ref 0–53)
AST: 24 U/L (ref 0–37)
Albumin: 4.7 g/dL (ref 3.5–5.2)
Alkaline Phosphatase: 65 U/L (ref 39–117)
Bilirubin, Direct: 0.3 mg/dL (ref 0.0–0.3)
Total Bilirubin: 1.9 mg/dL — ABNORMAL HIGH (ref 0.2–1.2)
Total Protein: 7.2 g/dL (ref 6.0–8.3)

## 2017-12-26 LAB — T4, FREE: Free T4: 0.71 ng/dL (ref 0.60–1.60)

## 2017-12-26 LAB — TSH: TSH: 2.13 u[IU]/mL (ref 0.35–4.50)

## 2017-12-26 NOTE — Patient Instructions (Addendum)
Check into   Orthotics at [facility]   .  Let us know if we need to look for other referral.   Urine is clear today  Checking lab .  Still have concerns about  Visible blood   And planning on  Renal ultrasound to check kidney  . You will be contacted about this .  Monitoring lab today .     Preventive Care 18-39 Years, Male Preventive care refers to lifestyle choices and visits with your health care provider that can promote health and wellness. What does preventive care include?  A yearly physical exam. This is also called an annual well check.  Dental exams once or twice a year.  Routine eye exams. Ask your health care provider how often you should have your eyes checked.  Personal lifestyle choices, including: ? Daily care of your teeth and gums. ? Regular physical activity. ? Eating a healthy diet. ? Avoiding tobacco and drug use. ? Limiting alcohol use. ? Practicing safe sex. What happens during an annual well check? The services and screenings done by your health care provider during your annual well check will depend on your age, overall health, lifestyle risk factors, and family history of disease. Counseling Your health care provider may ask you questions about your:  Alcohol use.  Tobacco use.  Drug use.  Emotional well-being.  Home and relationship well-being.  Sexual activity.  Eating habits.  Work and work Statistician.  Screening You may have the following tests or measurements:  Height, weight, and BMI.  Blood pressure.  Lipid and cholesterol levels. These may be checked every 5 years starting at age 36.  Diabetes screening. This is done by checking your blood sugar (glucose) after you have not eaten for a while (fasting).  Skin check.  Hepatitis C blood test.  Hepatitis B blood test.  Sexually transmitted disease (STD) testing.  Discuss your test results, treatment options, and if necessary, the need for more tests with your  health care provider. Vaccines Your health care provider may recommend certain vaccines, such as:  Influenza vaccine. This is recommended every year.  Tetanus, diphtheria, and acellular pertussis (Tdap, Td) vaccine. You may need a Td booster every 10 years.  Varicella vaccine. You may need this if you have not been vaccinated.  HPV vaccine. If you are 11 or younger, you may need three doses over 6 months.  Measles, mumps, and rubella (MMR) vaccine. You may need at least one dose of MMR.You may also need a second dose.  Pneumococcal 13-valent conjugate (PCV13) vaccine. You may need this if you have certain conditions and have not been vaccinated.  Pneumococcal polysaccharide (PPSV23) vaccine. You may need one or two doses if you smoke cigarettes or if you have certain conditions.  Meningococcal vaccine. One dose is recommended if you are age 109-21 years and a first-year college student living in a residence hall, or if you have one of several medical conditions. You may also need additional booster doses.  Hepatitis A vaccine. You may need this if you have certain conditions or if you travel or work in places where you may be exposed to hepatitis A.  Hepatitis B vaccine. You may need this if you have certain conditions or if you travel or work in places where you may be exposed to hepatitis B.  Haemophilus influenzae type b (Hib) vaccine. You may need this if you have certain risk factors.  Talk to your health care provider about which  screenings and vaccines you need and how often you need them. This information is not intended to replace advice given to you by your health care provider. Make sure you discuss any questions you have with your health care provider. Document Released: 03/05/2001 Document Revised: 09/27/2015 Document Reviewed: 11/08/2014 Elsevier Interactive Patient Education  [employer].

## 2017-12-27 DIAGNOSIS — Z87448 Personal history of other diseases of urinary system: Secondary | ICD-10-CM | POA: Insufficient documentation

## 2017-12-27 DIAGNOSIS — Z87898 Personal history of other specified conditions: Secondary | ICD-10-CM | POA: Insufficient documentation

## 2017-12-30 DIAGNOSIS — R32 Unspecified urinary incontinence: Secondary | ICD-10-CM | POA: Diagnosis not present

## 2018-01-06 DIAGNOSIS — R32 Unspecified urinary incontinence: Secondary | ICD-10-CM | POA: Diagnosis not present

## 2018-01-12 ENCOUNTER — Ambulatory Visit
Admission: RE | Admit: 2018-01-12 | Discharge: 2018-01-12 | Disposition: A | Discharge status: Home / Self Care | Payer: 59 | Source: Ambulatory Visit | Attending: Internal Medicine | Admitting: Internal Medicine

## 2018-01-12 DIAGNOSIS — Q909 Down syndrome, unspecified: Secondary | ICD-10-CM

## 2018-01-12 DIAGNOSIS — R300 Dysuria: Secondary | ICD-10-CM

## 2018-01-12 DIAGNOSIS — R310 Gross hematuria: Secondary | ICD-10-CM | POA: Diagnosis not present

## 2018-01-12 DIAGNOSIS — Z87898 Personal history of other specified conditions: Secondary | ICD-10-CM

## 2018-01-12 DIAGNOSIS — Z87448 Personal history of other diseases of urinary system: Secondary | ICD-10-CM

## 2018-01-12 DIAGNOSIS — Q249 Congenital malformation of heart, unspecified: Secondary | ICD-10-CM

## 2018-01-12 NOTE — Telephone Encounter (Unsigned)
Copied from CRM [phone]. Topic: General - Inquiry >> Jan 12, 2018  2:30 PM Lorayne BenderAlexander, Amber L wrote: Reason for CRM:   Pt's father calling.  States pt had a renal ultrasound done about an hour ago and they were told there was something found, and that Dr. Fabian SharpPanosh should have the report within an hour so she could contact pt.  Pt's father is wanting to know if that report is in and what the results were. Theron Aristaeter can be reached at [phone]

## 2018-01-22 ENCOUNTER — Telehealth: Payer: Self-pay

## 2018-01-22 DIAGNOSIS — R198 Other specified symptoms and signs involving the digestive system and abdomen: Secondary | ICD-10-CM

## 2018-01-22 DIAGNOSIS — K8020 Calculus of gallbladder without cholecystitis without obstruction: Secondary | ICD-10-CM

## 2018-01-22 DIAGNOSIS — Q909 Down syndrome, unspecified: Secondary | ICD-10-CM

## 2018-01-22 NOTE — Telephone Encounter (Signed)
Copied from CRM [id number]. Topic: General - Inquiry >> Jan 22, 2018 11:52 AM Windy Kalata, NT wrote: Reason for CRM: patients mother is calling and states that during the Echo of Kidneys they found gall stones and she states that someone spoke with her husband and told her that since they were not bothering him that they were not going to do anything with the gall stones yet. Patient states he has been having vomiting and she would rather go ahead and handle the gall stones rather than waiting. She would like Dr. Fabian Sharp to give her a call directly. Please advise.  She can reached at any time. [phone].

## 2018-01-23 NOTE — Telephone Encounter (Signed)
I wasn't here when message came in   Please get mom on the phone and and I can talk to them

## 2018-01-23 NOTE — Telephone Encounter (Signed)
repoted to mom that  May want to check with  Peds ortho about better smo and foot wear  .

## 2018-01-23 NOTE — Telephone Encounter (Signed)
Copied from CRM [id number]. Topic: General - Inquiry >> Jan 22, 2018 11:52 AM Windy Kalata, NT wrote: Reason for CRM: patients mother is calling and states that during the Echo of Kidneys they found gall stones and she states that someone spoke with her husband and told her that since they were not bothering him that they were not going to do anything with the gall stones yet. Patient states he has been having vomiting and she would rather go ahead and handle the gall stones rather than waiting. She would like Dr. Fabian Sharp to give her a call directly. Please advise.  She can reached at any time. [phone]. >> Jan 23, 2018 12:27 PM Jilda Roche wrote: Dad called back today and would like a call today before the weekend, please advise  Best call back is [phone] mom  [phone] Dad

## 2018-01-23 NOTE — Telephone Encounter (Signed)
Please advise Dr Panosh, thanks.   

## 2018-01-23 NOTE — Telephone Encounter (Signed)
Will send back to Dr Fabian Sharp -- she is talking to the patient's Mother now

## 2018-01-23 NOTE — Telephone Encounter (Signed)
Talked to mom  Nathaniel Jimenez tends to hold his right side where GB is and has  Some post prandial vomiting attacks at times and mom concerned it could be his gb instead of just reflux .  Hard to tell pain with him as her processes this differently .   Disc  Sx and optinos for evaluation.   Has seen gi in past for vomiting but no  Attention to GB possible.  Currently no fever and eats healthy avoiding aggravating foods.  Will proceed with surgery consult.

## 2018-01-27 ENCOUNTER — Other Ambulatory Visit: Payer: Self-pay | Admitting: Internal Medicine

## 2018-01-27 DIAGNOSIS — R718 Other abnormality of red blood cells: Secondary | ICD-10-CM

## 2018-01-30 ENCOUNTER — Encounter: Payer: Self-pay | Admitting: Family Medicine

## 2018-01-30 ENCOUNTER — Ambulatory Visit (INDEPENDENT_AMBULATORY_CARE_PROVIDER_SITE_OTHER): Payer: 59 | Admitting: Family Medicine

## 2018-01-30 VITALS — BP 98/60 | HR 70 | Temp 98.5°F

## 2018-01-30 DIAGNOSIS — H66002 Acute suppurative otitis media without spontaneous rupture of ear drum, left ear: Secondary | ICD-10-CM

## 2018-01-30 DIAGNOSIS — J069 Acute upper respiratory infection, unspecified: Secondary | ICD-10-CM

## 2018-01-30 DIAGNOSIS — B9789 Other viral agents as the cause of diseases classified elsewhere: Secondary | ICD-10-CM | POA: Diagnosis not present

## 2018-01-30 MED ORDER — AMOXICILLIN 400 MG/5ML PO SUSR: 500.0000 mg | Freq: Two times a day (BID) | ORAL | 0 refills | Status: AC

## 2018-01-30 NOTE — Patient Instructions (Signed)

## 2018-01-30 NOTE — Progress Notes (Signed)
Subjective:    Patient ID: Nathaniel Jimenez, male    DOB: [birth year], 28 y.o.   MRN: [id number]  No chief complaint on file.   HPI Patient was seen today for acute concern.  Pt accompanied by mom.  Pt is nonverbal at baseline with h/o autism, Down's syndrome, ASD, congenital heart dz.  Per mom pt with chills, rhinorrhea, and cough x 3 days.  Pt has been making motions towards his ears/head more.  Given ibuprofen.  Sick contacts may include people at the day program pt attends.  Past Medical History:  Diagnosis Date  . Autism   . Autism   . Down syndrome   . Down syndrome   . Heart murmur   . Heart murmur   . Vomiting 02/08/2015    No Known Allergies  ROS General: Denies fever, chills, night sweats, changes in weight, changes in appetite  +chills HEENT: Denies headaches, ear pain, changes in vision, sore throat  +rhinorrhea CV: Denies CP, palpitations, SOB, orthopnea Pulm: Denies SOB, wheezing  +cough GI: Denies abdominal pain, nausea, vomiting, diarrhea, constipation GU: Denies dysuria, hematuria, frequency, vaginal discharge Msk: Denies muscle cramps, joint pains Neuro: Denies weakness, numbness, tingling Skin: Denies rashes, bruising Psych: Denies depression, anxiety, hallucinations    Objective:    Blood pressure 98/60, pulse 70, temperature 98.5 F (36.9 C), temperature source Temporal, SpO2 94 %.  Gen. Pleasant, well-nourished, in no distress, normal affect   HEENT: [place]/AT, face symmetric,no scleral icterus, PERRLA, nares patent with drainage and erythema, pharynx without erythema or exudate.  Right TM full.  Left TM full with purulence and erythema, nonruptured.  Pt appears uncomfortable when ears examined. Lungs: productive sounding cough, no accessory muscle use, CTAB, no wheezes or rales Cardiovascular: RRR, no m/r/g, no peripheral edema Neuro:  A&Ox3, CN II-XII intact, normal gait  Wt Readings from Last 3 Encounters:  12/26/17 101 lb 8 oz (46 kg)  03/12/17 99 lb 3.2  oz (45 kg)  02/17/17 99 lb 3.2 oz (45 kg)    Lab Results  Component Value Date   WBC 3.6 (L) 12/26/2017   HGB 16.2 12/26/2017   HCT 47.1 12/26/2017   PLT 187.0 12/26/2017   GLUCOSE 82 12/26/2017   CHOL 136 11/08/2016   TRIG 77.0 11/08/2016   HDL 37.80 (L) 11/08/2016   LDLCALC 83 11/08/2016   ALT 31 12/26/2017   AST 24 12/26/2017   NA 140 12/26/2017   K 3.9 12/26/2017   CL 100 12/26/2017   CREATININE 0.98 12/26/2017   BUN 16 12/26/2017   CO2 31 12/26/2017   TSH 2.13 12/26/2017    Assessment/Plan:  Non-recurrent acute suppurative otitis media of left ear without spontaneous rupture of tympanic membrane  -no h/o allergies to medicines -given handout -ok to give Tylenol or ibuprofen for pain/discomfort or fever. - Plan: amoxicillin (AMOXIL) 400 MG/5ML suspension -f/u prn  Viral URI with cough -supportive care.  Stay hydrated, get plenty of rest. -ok to continue Ibuprofen or Tylenol prn  Abbe Amsterdam, MD

## 2018-02-04 ENCOUNTER — Ambulatory Visit (INDEPENDENT_AMBULATORY_CARE_PROVIDER_SITE_OTHER): Payer: 59 | Admitting: Internal Medicine

## 2018-02-04 ENCOUNTER — Ambulatory Visit: Payer: Self-pay | Admitting: *Deleted

## 2018-02-04 ENCOUNTER — Encounter: Payer: Self-pay | Admitting: Internal Medicine

## 2018-02-04 VITALS — BP 118/62 | HR 90 | Temp 97.6°F | Wt 97.5 lb

## 2018-02-04 DIAGNOSIS — H65191 Other acute nonsuppurative otitis media, right ear: Secondary | ICD-10-CM

## 2018-02-04 DIAGNOSIS — Q249 Congenital malformation of heart, unspecified: Secondary | ICD-10-CM

## 2018-02-04 DIAGNOSIS — J22 Unspecified acute lower respiratory infection: Secondary | ICD-10-CM | POA: Diagnosis not present

## 2018-02-04 DIAGNOSIS — Q909 Down syndrome, unspecified: Secondary | ICD-10-CM

## 2018-02-04 NOTE — Telephone Encounter (Signed)
Contacted pt's mother, Coy Saunas,  regarding his symptoms; she says that the pt has a non-productive cough that has been going on since appointment on 01/30/2018;tshe says that the pt is still taking the amoxicillin that was given at that time for an ear infection; the pt also said that once on 02/03/2018 the pt did cough up a bit of mucus but swallowed it before she could get it out of his mouth; nurse triage initiated and recommendations made per triage protocol; pt offered and accepted appointment with Dr Fabian Sharp, [facility], 02/04/2018 at 1600; she verbalized understanding; will route to office for notification of this upcoming appointment.   Reason for Disposition . [1] Nasal discharge AND [2] present > 10 days  Answer Assessment - Initial Assessment Questions 1. ONSET: "When did the cough begin?"      Seen in office 01/30/2018 2. SEVERITY: "How bad is the cough today?"     Intermittent; mild; typically during the day  3. RESPIRATORY DISTRESS: "Describe your breathing."      Breathing ok; ? chest congestion 4. FEVER: "Do you have a fever?" If so, ask: "What is your temperature, how was it measured, and when did it start?"     no 5. HEMOPTYSIS: "Are you coughing up any blood?" If so ask: "How much?" (flecks, streaks, tablespoons, etc.)     no 6. TREATMENT: "What have you done so far to treat the cough?" (e.g., meds, fluids, humidifier)     nothing 7. CARDIAC HISTORY: "Do you have any history of heart disease?" (e.g., heart attack, congestive heart failure)      Heart surgery at 2 months old and 10 months old 8. LUNG HISTORY: "Do you have any history of lung disease?"  (e.g., pulmonary embolus, asthma, emphysema)     no 9. PE RISK FACTORS: "Do you have a history of blood clots?" (or: recent major surgery, recent prolonged travel, bedridden)     no 10. OTHER SYMPTOMS: "Do you have any other symptoms? (e.g., runny nose, wheezing, chest pain)       ? Chest congestion; runny nose with "whitish"  secretions 11. PREGNANCY: "Is there any chance you are pregnant?" "When was your last menstrual period?"       n/a 12. TRAVEL: "Have you traveled out of the country in the last month?" (e.g., travel history, exposures)       No travel outside country; pt goes to a day program; pt has not had flu shot this year  Protocols used: COUGH - ACUTE NON-PRODUCTIVE-A-AH

## 2018-02-04 NOTE — Progress Notes (Signed)
Chief Complaint  Patient presents with  . Cough    x 6 days. Pt mother states it is a weezy cough. Pt coughed up pale yellow color yesterday     HPI: Nathaniel Jimenez 28 y.o. come in for on going resp sx  Seen jan 10 dr banks   For OM  And left ear feels much better   And per  Nurse triage today   For ongoing cough  No sob not at night but deep cough and  loose   Has nasal drainage but eating normal and no fever   Remote hx of pna but tnos sx of this  No vomiting  ROS: See pertinent positives and negatives per HPI. No anorexia vomiting diarrhea   Past Medical History:  Diagnosis Date  . Autism   . Autism   . Down syndrome   . Down syndrome   . Heart murmur   . Heart murmur   . Vomiting 02/08/2015    Family History  Problem Relation Age of Onset  . Healthy Mother   . Healthy Father   . Cancer Maternal Grandmother   . Bladder Cancer Maternal Grandfather   . Healthy Paternal Grandmother   . Healthy Paternal Grandfather     Social History   Socioeconomic History  . Marital status: Single    Spouse name: Not on file  . Number of children: 0  . Years of education: Not on file  . Highest education level: Not on file  Occupational History  . Occupation: unemployed   Social Needs  . Financial resource strain: Not on file  . Food insecurity:    Worry: Not on file    Inability: Not on file  . Transportation needs:    Medical: Not on file    Non-medical: Not on file  Tobacco Use  . Smoking status: Never Smoker  . Smokeless tobacco: Never Used  Substance and Sexual Activity  . Alcohol use: No    Alcohol/week: 0.0 standard drinks  . Drug use: No  . Sexual activity: Not on file  Lifestyle  . Physical activity:    Days per week: Not on file    Minutes per session: Not on file  . Stress: Not on file  Relationships  . Social connections:    Talks on phone: Not on file    Gets together: Not on file    Attends religious service: Not on file    Active member of club or  organization: Not on file    Attends meetings of clubs or organizations: Not on file    Relationship status: Not on file  Other Topics Concern  . Not on file  Social History Narrative   Lives with parents and is non-verbal, autistic and has down syndrome.    Born in [place] had heart surgery yet L lived in [place] outside of [place] before moving to [place] with family. For fathers job Acupuncturist.    Is in [facility].       Outpatient Medications Prior to Visit  Medication Sig Dispense Refill  . amoxicillin (AMOXIL) 400 MG/5ML suspension Take 6.3 mLs (500 mg total) by mouth 2 (two) times daily for 7 days. 100 mL 0   No facility-administered medications prior to visit.      EXAM:  BP 118/62 (BP Location: Right Arm, Patient Position: Sitting, Cuff Size: Normal)   Pulse 90   Temp 97.6 F (36.4 C) (Oral)   Wt 97 lb 8  oz (44.2 kg)   BMI 21.18 kg/m   Body mass index is 21.18 kg/m.  GENERAL: vitals reviewed and listed above, alert, oriented, appears well hydrated and in no acute distress thick white dc nose  No resp distress   No retractions or flaring HEENT: atraumatic, conjunctiva  clear, no obvious abnormalities on inspection of external nose and ears  Nose congested  Tm breif exam   Grey no dc noted OP : no lesion edema or exudate  NECK: no obvious masses on inspection palpation  LUNGS: clear to auscultation bilaterally, no wheezes, rales or rhonchi, limited exam becaue not taking  Deep breaths but no retractions  Or rales CV: HRRR,   Mobile  Active   Lab Results  Component Value Date   WBC 3.6 (L) 12/26/2017   HGB 16.2 12/26/2017   HCT 47.1 12/26/2017   PLT 187.0 12/26/2017   GLUCOSE 82 12/26/2017   CHOL 136 11/08/2016   TRIG 77.0 11/08/2016   HDL 37.80 (L) 11/08/2016   LDLCALC 83 11/08/2016   ALT 31 12/26/2017   AST 24 12/26/2017   NA 140 12/26/2017   K 3.9 12/26/2017   CL 100 12/26/2017   CREATININE 0.98 12/26/2017   BUN 16  12/26/2017   CO2 31 12/26/2017   TSH 2.13 12/26/2017   BP Readings from Last 3 Encounters:  02/04/18 118/62  01/30/18 98/60  12/26/17 104/68    ASSESSMENT AND PLAN:  Discussed the following assessment and plan:  Acute respiratory infection  Other acute nonsuppurative otitis media of right ear, recurrence not specified  Down syndrome  Adult congenital heart disease Sx improving    No alam findings  Disc poss x ray but not indicated at this  time  amox should help ear and bact sinus     Expectant management. And fu if  persistent or progressive  He has no resp distress   Nor dec activity or fever. -Patient advised to return or notify health care team  if  new concerns arise.  Patient Instructions  Chest exam is unremarkable although taking not deep breaths   If gets  Short of breath  Fevers unexplained .   Or not better in another 7-10 days  Let us know  No signs of pneumonia  at this time . I think  he ear is better  On the  Short exam today .   Consider .  chest x ray if needed.     Neta Mends. Dior Stepter M.D.

## 2018-02-04 NOTE — Patient Instructions (Addendum)
Chest exam is unremarkable although taking not deep breaths   If gets  Short of breath  Fevers unexplained .   Or not better in another 7-10 days  Let us know  No signs of pneumonia  at this time . I think  he ear is better  On the  Short exam today .   Consider .  chest x ray if needed.

## 2018-02-08 DIAGNOSIS — R32 Unspecified urinary incontinence: Secondary | ICD-10-CM | POA: Diagnosis not present

## 2018-02-09 ENCOUNTER — Other Ambulatory Visit: Payer: Self-pay | Admitting: Surgery

## 2018-02-09 DIAGNOSIS — K8020 Calculus of gallbladder without cholecystitis without obstruction: Secondary | ICD-10-CM | POA: Diagnosis not present

## 2018-02-11 ENCOUNTER — Telehealth: Payer: Self-pay | Admitting: Internal Medicine

## 2018-02-11 NOTE — Telephone Encounter (Signed)
Copied from CRM [id number]. Topic: Quick Communication - See Telephone Encounter >> Feb 11, 2018  2:55 PM Angela Nevin wrote: CRM for notification. See Telephone encounter for: 02/11/18.  Patients father called stating that patient has been scheduled per referral (see referral 01/03) for 01/30. Patients father would like to know if there is anything additional that they should do/look for medically (tests, procedures) while patient is under anesthesia. Patients father is requesting a call back to discuss. Please advise.

## 2018-02-11 NOTE — Telephone Encounter (Signed)
Please advise Dr Panosh, thanks.   

## 2018-02-13 NOTE — Progress Notes (Addendum)
PCP - Berniece Andreas, MD Cardiologist - Dr. Delrae Sawyers, MD  Chest x-ray - pt family denies EKG - 03/12/2017 in EPIC   Stress Test - pt family denies past 5 years ECHO - 2019 in Epic-Results in Care Everywhere  Cardiac Cath - as a child  Sleep Study - n/a CPAP - n/a  Fasting Blood Sugar - n/a Checks Blood Sugar _____ times a day-n/a  Blood Thinner Instructions: pt family denies Aspirin Instructions:pt family denies  Anesthesia review: Yes, Heart hx, requested EKG for comparison  Patient is nonverbal, his parents are with him this morning. Patient family denies shortness of breath, fever, cough and chest pain at PAT appointment  Patient family verbalized understanding of instructions that were given to them at the PAT appointment. Patient was also instructed that they will need to review over the PAT instructions again at home before surgery.

## 2018-02-13 NOTE — Telephone Encounter (Signed)
Not enough info  But I assume   Question is regarding GB removal  ..  Advise get clearance from cardiology  . ( should be ok since has been seen recently)   Anesthesia should do consult  Pre op  for  Neck  Stability   And airway assessment because  Persons with downs can have  Neck spine instability  . Often a neck x ray is done if not done in past  ..  Le me know if we need to do more

## 2018-02-13 NOTE — Pre-Procedure Instructions (Signed)
Nathaniel Jimenez  02/13/2018      [id number] - [place] - [address] [place] [address] Phone: [phone] Fax: [phone]    Your procedure is scheduled on February 19, 2018.  Report to [facility] Admitting at 700 AM.  Call this number if you have problems the morning of surgery:  [phone]   Remember:  Do not eat  after midnight.  You may drink clear liquids until 600 AM.  Clear liquids allowed are:  Water, Juice (non-citric and without pulp), Clear Tea, Black Coffee only and Gatorade    Take these medicines the morning of surgery with A SIP OF WATER -none  7 days prior to surgery STOP taking any Aspirin (unless otherwise instructed by your surgeon), Aleve, Naproxen, Ibuprofen, Motrin, Advil, Goody's, BC's, all herbal medications, fish oil, and all vitamins   Do not wear jewelry  Do not wear lotions, powders, or colognes, or deodorant.  Men may shave face and neck.  Do not bring valuables to the hospital.  [facility] is not responsible for any belongings or valuables.  Contacts, dentures or bridgework may not be worn into surgery.  Leave your suitcase in the car.  After surgery it may be brought to your room.  For patients admitted to the hospital, discharge time will be determined by your treatment team.  Patients discharged the day of surgery will not be allowed to drive home.    [facility]- Preparing For Surgery  Before surgery, you can play an important role. Because skin is not sterile, your skin needs to be as free of germs as possible. You can reduce the number of germs on your skin by washing with CHG (chlorahexidine gluconate) Soap before surgery.  CHG is an antiseptic cleaner which kills germs and bonds with the skin to continue killing germs even after washing.    Oral Hygiene is also important to reduce your risk of infection.  Remember - BRUSH YOUR TEETH THE MORNING OF SURGERY WITH YOUR REGULAR  TOOTHPASTE  Please do not use if you have an allergy to CHG or antibacterial soaps. If your skin becomes reddened/irritated stop using the CHG.  Do not shave (including legs and underarms) for at least 48 hours prior to first CHG shower. It is OK to shave your face.  Please follow these instructions carefully.   1. Shower the NIGHT BEFORE SURGERY and the MORNING OF SURGERY with CHG.   2. If you chose to wash your hair, wash your hair first as usual with your normal shampoo.  3. After you shampoo, rinse your hair and body thoroughly to remove the shampoo.  4. Use CHG as you would any other liquid soap. You can apply CHG directly to the skin and wash gently with a scrungie or a clean washcloth.   5. Apply the CHG Soap to your body ONLY FROM THE NECK DOWN.  Do not use on open wounds or open sores. Avoid contact with your eyes, ears, mouth and genitals (private parts). Wash Face and genitals (private parts)  with your normal soap.  6. Wash thoroughly, paying special attention to the area where your surgery will be performed.  7. Thoroughly rinse your body with warm water from the neck down.  8. DO NOT shower/wash with your normal soap after using and rinsing off the CHG Soap.  9. Pat yourself dry with a CLEAN TOWEL.  10. Wear CLEAN PAJAMAS to bed the night  before surgery, wear comfortable clothes the morning of surgery  11. Place CLEAN SHEETS on your bed the night of your first shower and DO NOT SLEEP WITH PETS.  Day of Surgery:  Do not apply any deodorants/lotions.  Please wear clean clothes to the hospital/surgery center.   Remember to brush your teeth WITH YOUR REGULAR TOOTHPASTE.  Please read over the following fact sheets that you were given.

## 2018-02-16 ENCOUNTER — Other Ambulatory Visit: Payer: Self-pay

## 2018-02-16 ENCOUNTER — Encounter (HOSPITAL_COMMUNITY)
Admission: RE | Admit: 2018-02-16 | Discharge: 2018-02-16 | Disposition: A | Discharge status: Home / Self Care | Payer: 59 | Source: Ambulatory Visit | Attending: Surgery | Admitting: Surgery

## 2018-02-16 ENCOUNTER — Encounter (HOSPITAL_COMMUNITY): Payer: Self-pay

## 2018-02-16 DIAGNOSIS — K8010 Calculus of gallbladder with chronic cholecystitis without obstruction: Secondary | ICD-10-CM | POA: Diagnosis not present

## 2018-02-16 DIAGNOSIS — F840 Autistic disorder: Secondary | ICD-10-CM | POA: Diagnosis not present

## 2018-02-16 DIAGNOSIS — Z01812 Encounter for preprocedural laboratory examination: Secondary | ICD-10-CM

## 2018-02-16 DIAGNOSIS — Q909 Down syndrome, unspecified: Secondary | ICD-10-CM | POA: Diagnosis not present

## 2018-02-16 DIAGNOSIS — K8020 Calculus of gallbladder without cholecystitis without obstruction: Secondary | ICD-10-CM | POA: Diagnosis present

## 2018-02-16 LAB — BASIC METABOLIC PANEL
ANION GAP: 8 (ref 5–15)
BUN: 16 mg/dL (ref 6–20)
CALCIUM: 9 mg/dL (ref 8.9–10.3)
CO2: 28 mmol/L (ref 22–32)
Chloride: 103 mmol/L (ref 98–111)
Creatinine, Ser: 1.04 mg/dL (ref 0.61–1.24)
GFR calc Af Amer: >60 mL/min (ref >60)
Glucose, Bld: 73 mg/dL (ref 70–99)
Potassium: 4.1 mmol/L (ref 3.5–5.1)
Sodium: 139 mmol/L (ref 135–145)

## 2018-02-16 LAB — CBC
HEMATOCRIT: 46.9 % (ref 39.0–52.0)
Hemoglobin: 16.4 g/dL (ref 13.0–17.0)
MCH: 34.3 pg — ABNORMAL HIGH (ref 26.0–34.0)
MCHC: 35 g/dL (ref 30.0–36.0)
MCV: 98.1 fL (ref 80.0–100.0)
Platelets: 163 10*3/uL (ref 150–400)
RBC: 4.78 MIL/uL (ref 4.22–5.81)
RDW: 12.5 % (ref 11.5–15.5)
WBC: 3.9 10*3/uL — ABNORMAL LOW (ref 4.0–10.5)
nRBC: 0 % (ref 0.0–0.2)

## 2018-02-16 NOTE — Telephone Encounter (Signed)
Pt mother has been advised and understands

## 2018-02-16 NOTE — Telephone Encounter (Signed)
Lvm for pt to call back crm created  

## 2018-02-16 NOTE — Progress Notes (Addendum)
Anesthesia Chart Review:  Case:  [id number] Date/Time:  02/19/18 0846   Procedure:  LAPAROSCOPIC CHOLECYSTECTOMY (N/A )   Anesthesia type:  General   Pre-op diagnosis:  symptomatic gallstones   Location:  [facility] OR ROOM 02 / [facility] OR   Surgeon:  Abigail Miyamoto, MD      DISCUSSION: 28 yo male with hx of Down syndrome, Atrioventricular septal defect s/p repair.  Pt follows with [facility] specialty cardiology, Dr. Delrae Sawyers. He was seen for initial consultation 12/04/2017. His note summarizes pt history "Absolutely delightful 28 year old gentleman with Trisomy 21 and a remotely repaired atrioventricular canal defect who is referred today to the [facility] to establish long term care. 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 Since that time, he has done well without any major issues. He was followed for several years by Pediatric Cardiology at [facility] and his last echocardiogram there in 2015 demonstrated mild to moderate mitral regurgitation through a small residual cleft and mild left ventricular outflow tract obstruction. Though he has developmental delay and is nonverbal, he has been expertly and lovingly cared for by his parents at home. He has excellent primary medical care as well. Other than some feeding issues, he has essentially been asymptomatic. He is surprisingly thin, though with good muscle definition, given his Down's. We sent him for an echocardiogram today which shows normal biventricular size and systolic function and mild mitral regurgitation. There is no evidence for pulmonary hypertension. He is active, liking to swim regularly and has had no significant change in his activity pattern. We will plan to see the patient back in the [facility] in ~ 1 year for follow-up with an  echocardiogram at that time."  Cardiac clearance 02/18/18 in care everywhere states "This letter is regarding our mutual patient Mr. [name], who is a 28 year old man with Trisomy 21 and a history of repaired complete atrioventricular septal defect. His repair is excellent and his heart is in outstanding condition. He has no cardiac limitations, and he is at overall low risk for cardiac complications of gallbladder surgery. There is no further cardiac testing required prior to surgery."  There are no cervical flex/ext xrays on file.   Anticipate he can proceed as planned baring acute status change.  VS: BP (!) 111/58   Temp 36.4 C (Axillary)   Resp 18   Ht 5' (1.524 m)   Wt 45.4 kg   BMI 19.53 kg/m   PROVIDERS: Panosh, Neta Mends, MD is PCP  Delrae Sawyers, MD is Cardiologist  LABS: Labs reviewed: Acceptable for surgery. (all labs ordered are listed, but only abnormal results are displayed)  Labs Reviewed  CBC - Abnormal; Notable for the following components:      Result Value   WBC 3.9 (*)    MCH 34.3 (*)    All other components within normal limits  BASIC METABOLIC PANEL     IMAGES: NECK SOFT TISSUES - 1+ VIEW 11/30/2016  COMPARISON:  None.  FINDINGS: There is no evidence of or epiglottic enlargement. The cervical airway is unremarkable and no radio-opaque foreign body identified. There is apparent retropharyngeal soft tissue thickening in the lower neck.  IMPRESSION: Apparent retropharyngeal soft tissue thickening. This may be positional, however it may represent a retropharyngeal or perispinal Abscess.  EKG: 12/04/2017 (outside record,  copy on pt chart): NSR with sinus arrhythmia. Rate 73. RBBB.  CV: Pediatric echo 12/04/2017: INTERPRETATION SUMMARY  History of AV canal defect s/p PA band in infancy and repair at 96 months of age.   Normal right and left ventricular size and systolic function.  A non-obstructive subaortic membrane is seen  approximately 12 mm below the aortic valve  Mild thickening of the aortic valve leaflets without stenosis  Mild left sided atrioventricular valve regurgitation with a residual mitral cleft  Mild right sided atrioventricular valve regurgitation    Past Medical History:  Diagnosis Date  . Autism   . Autism   . Down syndrome   . Down syndrome   . Heart murmur   . Heart murmur   . Vomiting 02/08/2015    Past Surgical History:  Procedure Laterality Date  . CARDIAC SURGERY     x2 - One at 3 months and one at 15 months    MEDICATIONS: . amoxicillin (AMOXIL) 500 MG capsule   No current facility-administered medications for this encounter.     Zannie Cove [facility]/Anesthesiology Phone [phone] 02/17/2018 9:39 AM

## 2018-02-17 DIAGNOSIS — R32 Unspecified urinary incontinence: Secondary | ICD-10-CM | POA: Diagnosis not present

## 2018-02-17 NOTE — Progress Notes (Signed)
1100 02/17/2018  Spoke with Dr. Eliberto Ivory office regarding a question for a request for a Cardiac clearance. Spoke PA Fayrene Fearing, and he sts the request was from the pcp Dr. Fabian Sharp. The office made aware that this request was not from our anesthesia department, but should addressed. All notes are available in the system to verify, as well.

## 2018-02-17 NOTE — Anesthesia Preprocedure Evaluation (Addendum)
Anesthesia Evaluation  Patient identified by MRN, date of birth, ID band Patient awake    Reviewed: Allergy & Precautions, NPO status , Patient's Chart, lab work & pertinent test results  Airway Mallampati: III  TM Distance: >3 FB Neck ROM: Full    Dental no notable dental hx.    Pulmonary neg pulmonary ROS,    Pulmonary exam normal breath sounds clear to auscultation       Cardiovascular Normal cardiovascular exam Rhythm:Regular Rate:Normal  ASD repair. Cardiac clearance.   Neuro/Psych Down's Syndrome negative psych ROS   GI/Hepatic negative GI ROS, Neg liver ROS,   Endo/Other  negative endocrine ROS  Renal/GU negative Renal ROS  negative genitourinary   Musculoskeletal negative musculoskeletal ROS (+)   Abdominal   Peds negative pediatric ROS (+)  Hematology negative hematology ROS (+)   Anesthesia Other Findings   Reproductive/Obstetrics negative OB ROS                            Anesthesia Physical Anesthesia Plan  ASA: III  Anesthesia Plan: General   Post-op Pain Management:    Induction: Intravenous  PONV Risk Score and Plan: 3 and Ondansetron, Dexamethasone, Midazolam and Treatment may vary due to age or medical condition  Airway Management Planned: Oral ETT  Additional Equipment:   Intra-op Plan:   Post-operative Plan: Extubation in OR  Informed Consent: I have reviewed the patients History and Physical, chart, labs and discussed the procedure including the risks, benefits and alternatives for the proposed anesthesia with the patient or authorized representative who has indicated his/her understanding and acceptance.     Dental advisory given  Plan Discussed with: CRNA  Anesthesia Plan Comments: (See PAT note by Antionette Poles, PA-C  Precedex load for post-op sedation.)       Anesthesia Quick Evaluation

## 2018-02-18 NOTE — H&P (Signed)
   Ajith Gaby Platten Documented: 02/09/2018 2:09 PM Location: [facility] Patient #: [id number] DOB: [birth year] Single / Language: [place] / Race: White Male   History of Present Illness Riley Lam A. Magnus Ivan MD; 02/09/2018 2:31 PM) The patient is a 28 year old male who presents for evaluation of gall stones. This is a 28 year old gentleman with Down syndrome and autism who is account by his parents today. He is referred by Dr. Berniece Andreas for possible symptomatic gallstones. He has issues with food intolerance for years with right upper quadrant abdominal pain, nausea, and vomiting. 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 He measures and had an ultrasound showing gallstones. The bile duct was normal. He has had elevated liver function tests in the past. He is nonverbal. He is difficult to assess secondary to this. He has had no fevers or chills. He is followed closely by his primary care physician as well as physicians at [facility].   Allergies (Sabrina Canty, CMA; 02/09/2018 2:10 PM) No Known Allergies [02/09/2018]: Allergies Reconciled   Medication History Kristen Cardinal, CMA; 02/09/2018 2:10 PM) No Current Medications Medications Reconciled  Vitals (Sabrina Canty CMA; 02/09/2018 2:11 PM) 02/09/2018 2:10 PM Weight: 100.06 lb Height: 60in Body Surface Area: 1.39 m Body Mass Index: 19.54 kg/m  Temp.: 29F(Temporal)  Pulse: 98 (Regular)  BP: 102/62 (Sitting, Left Arm, Standard)       Physical Exam (Linkin Vizzini A. Magnus Ivan MD; 02/09/2018 2:31 PM) General Mental Status-Alert. General Appearance-Consistent with stated age. Hydration-Well hydrated. Voice-Normal.  Head and Neck Head-normocephalic, atraumatic with no lesions or palpable masses.  Eye Eyeball - Bilateral-Extraocular movements intact. Sclera/Conjunctiva - Bilateral-No scleral icterus.  Chest and Lung Exam Chest and lung exam reveals -quiet, even and easy  respiratory effort with no use of accessory muscles and on auscultation, normal breath sounds, no adventitious sounds and normal vocal resonance. Inspection Chest Wall - Normal. Back - normal.  Cardiovascular Cardiovascular examination reveals -on palpation PMI is normal in location and amplitude, no palpable S3 or S4. Normal cardiac borders., normal heart sounds, regular rate and rhythm with no murmurs, carotid auscultation reveals no bruits and normal pedal pulses bilaterally.  Abdomen Inspection Inspection of the abdomen reveals - No Hernias. Skin - Scar - no surgical scars. Palpation/Percussion Palpation and Percussion of the abdomen reveal - Soft, Non Tender, No Rebound tenderness, No Rigidity (guarding) and No hepatosplenomegaly. Auscultation Auscultation of the abdomen reveals - Bowel sounds normal.  Neurologic - Did not examine.  Musculoskeletal - Did not examine.    Assessment & Plan (Naina Sleeper A. Magnus Ivan MD; 02/09/2018 2:33 PM) SYMPTOMATIC CHOLELITHIASIS (K80.20) Impression: I reviewed all his x-ray studies. Again, he does have gallstones and may be symptomatic from this. It will always be difficult to tell given his Down syndrome and autism as well as being nonverbal. I discussed continued conservative management versus proceeding with a laparoscopic cholecystectomy. I gave his parents literature regarding this. We discussed the surgical procedure in detail. We discussed the risks which includes but is not limited to bleeding, infection, injury to surrounding structures, the need to convert to an open procedure, the chances may not resolve all his symptoms, etc. They understand and want to proceed with surgery which will be scheduled

## 2018-02-19 ENCOUNTER — Encounter (HOSPITAL_COMMUNITY): Payer: Self-pay | Admitting: *Deleted

## 2018-02-19 ENCOUNTER — Other Ambulatory Visit: Payer: Self-pay

## 2018-02-19 ENCOUNTER — Encounter (HOSPITAL_COMMUNITY)
Admission: RE | Disposition: A | Discharge status: Home / Self Care | Payer: Self-pay | Source: Home / Self Care | Attending: Surgery

## 2018-02-19 ENCOUNTER — Observation Stay (HOSPITAL_COMMUNITY)
Admission: RE | Admit: 2018-02-19 | Discharge: 2018-02-19 | Disposition: A | Discharge status: Home / Self Care | Payer: 59 | Attending: Surgery | Admitting: Surgery

## 2018-02-19 ENCOUNTER — Ambulatory Visit (HOSPITAL_COMMUNITY): Payer: 59 | Admitting: Anesthesiology

## 2018-02-19 ENCOUNTER — Ambulatory Visit (HOSPITAL_COMMUNITY): Payer: 59 | Admitting: Physician Assistant

## 2018-02-19 DIAGNOSIS — Z9049 Acquired absence of other specified parts of digestive tract: Secondary | ICD-10-CM

## 2018-02-19 DIAGNOSIS — K8010 Calculus of gallbladder with chronic cholecystitis without obstruction: Principal | ICD-10-CM | POA: Insufficient documentation

## 2018-02-19 DIAGNOSIS — F840 Autistic disorder: Secondary | ICD-10-CM | POA: Insufficient documentation

## 2018-02-19 DIAGNOSIS — Q909 Down syndrome, unspecified: Secondary | ICD-10-CM | POA: Insufficient documentation

## 2018-02-19 HISTORY — PX: CHOLECYSTECTOMY: SHX55

## 2018-02-19 SURGERY — LAPAROSCOPIC CHOLECYSTECTOMY
Anesthesia: General | Site: Abdomen

## 2018-02-19 MED ORDER — SODIUM CHLORIDE 0.9 % IR SOLN
Status: DC | PRN
  Administered 2018-02-19: 1000 mL

## 2018-02-19 MED ORDER — GABAPENTIN 300 MG PO CAPS: 300.0000 mg | ORAL_CAPSULE | ORAL | Status: DC

## 2018-02-19 MED ORDER — KETOROLAC TROMETHAMINE 30 MG/ML IJ SOLN
INTRAMUSCULAR | Status: AC
  Filled 2018-02-19: qty 1

## 2018-02-19 MED ORDER — GLYCOPYRROLATE 0.2 MG/ML IJ SOLN
INTRAMUSCULAR | Status: DC | PRN
  Administered 2018-02-19: 0.2 mg via INTRAVENOUS

## 2018-02-19 MED ORDER — CHLORHEXIDINE GLUCONATE CLOTH 2 % EX PADS: 6.0000 | MEDICATED_PAD | Freq: Once | CUTANEOUS | Status: DC

## 2018-02-19 MED ORDER — ACETAMINOPHEN 500 MG PO TABS
ORAL_TABLET | ORAL | Status: AC
  Filled 2018-02-19: qty 2

## 2018-02-19 MED ORDER — ONDANSETRON HCL 4 MG/2ML IJ SOLN: 4.0000 mg | Freq: Four times a day (QID) | INTRAMUSCULAR | Status: DC | PRN

## 2018-02-19 MED ORDER — MIDAZOLAM HCL 2 MG/2ML IJ SOLN
INTRAMUSCULAR | Status: AC
  Filled 2018-02-19: qty 2

## 2018-02-19 MED ORDER — ENOXAPARIN SODIUM 40 MG/0.4ML ~~LOC~~ SOLN: 40.0000 mg | SUBCUTANEOUS | Status: DC

## 2018-02-19 MED ORDER — BUPIVACAINE-EPINEPHRINE (PF) 0.25% -1:200000 IJ SOLN
INTRAMUSCULAR | Status: AC
  Filled 2018-02-19: qty 30

## 2018-02-19 MED ORDER — TRAMADOL HCL 50 MG PO TABS: 50.0000 mg | ORAL_TABLET | Freq: Four times a day (QID) | ORAL | 0 refills | Status: DC | PRN

## 2018-02-19 MED ORDER — SUGAMMADEX SODIUM 200 MG/2ML IV SOLN
INTRAVENOUS | Status: DC | PRN
  Administered 2018-02-19: 100 mg via INTRAVENOUS

## 2018-02-19 MED ORDER — CEFAZOLIN SODIUM-DEXTROSE 2-4 GM/100ML-% IV SOLN
2.0000 g | INTRAVENOUS | Status: AC
  Administered 2018-02-19: 2 g via INTRAVENOUS

## 2018-02-19 MED ORDER — FENTANYL CITRATE (PF) 100 MCG/2ML IJ SOLN: 25.0000 ug | INTRAMUSCULAR | Status: DC | PRN

## 2018-02-19 MED ORDER — KETOROLAC TROMETHAMINE 30 MG/ML IJ SOLN
INTRAMUSCULAR | Status: DC | PRN
  Administered 2018-02-19: 30 mg via INTRAVENOUS

## 2018-02-19 MED ORDER — FENTANYL CITRATE (PF) 250 MCG/5ML IJ SOLN
INTRAMUSCULAR | Status: AC
  Filled 2018-02-19: qty 5

## 2018-02-19 MED ORDER — FENTANYL CITRATE (PF) 100 MCG/2ML IJ SOLN
INTRAMUSCULAR | Status: DC | PRN
  Administered 2018-02-19 (×3): 50 ug via INTRAVENOUS

## 2018-02-19 MED ORDER — DEXMEDETOMIDINE HCL 200 MCG/2ML IV SOLN
INTRAVENOUS | Status: DC | PRN
  Administered 2018-02-19 (×4): 4 ug via INTRAVENOUS

## 2018-02-19 MED ORDER — CEFAZOLIN SODIUM-DEXTROSE 2-4 GM/100ML-% IV SOLN
INTRAVENOUS | Status: AC
  Filled 2018-02-19: qty 100

## 2018-02-19 MED ORDER — METOCLOPRAMIDE HCL 5 MG/ML IJ SOLN: 10.0000 mg | Freq: Once | INTRAMUSCULAR | Status: DC | PRN

## 2018-02-19 MED ORDER — ONDANSETRON 4 MG PO TBDP: 4.0000 mg | ORAL_TABLET | Freq: Four times a day (QID) | ORAL | Status: DC | PRN

## 2018-02-19 MED ORDER — LACTATED RINGERS IV SOLN: INTRAVENOUS | Status: DC

## 2018-02-19 MED ORDER — TRAMADOL HCL 50 MG PO TABS: 50.0000 mg | ORAL_TABLET | Freq: Four times a day (QID) | ORAL | Status: DC | PRN

## 2018-02-19 MED ORDER — ONDANSETRON HCL 4 MG/2ML IJ SOLN
INTRAMUSCULAR | Status: AC
  Filled 2018-02-19: qty 2

## 2018-02-19 MED ORDER — DEXAMETHASONE SODIUM PHOSPHATE 10 MG/ML IJ SOLN
INTRAMUSCULAR | Status: AC
  Filled 2018-02-19: qty 1

## 2018-02-19 MED ORDER — DEXAMETHASONE SODIUM PHOSPHATE 10 MG/ML IJ SOLN
INTRAMUSCULAR | Status: DC | PRN
  Administered 2018-02-19: 10 mg via INTRAVENOUS

## 2018-02-19 MED ORDER — PROPOFOL 10 MG/ML IV BOLUS
INTRAVENOUS | Status: DC | PRN
  Administered 2018-02-19: 150 mg via INTRAVENOUS

## 2018-02-19 MED ORDER — MIDAZOLAM HCL 5 MG/5ML IJ SOLN
INTRAMUSCULAR | Status: DC | PRN
  Administered 2018-02-19: 2 mg via INTRAVENOUS

## 2018-02-19 MED ORDER — ONDANSETRON HCL 4 MG/2ML IJ SOLN
INTRAMUSCULAR | Status: DC | PRN
  Administered 2018-02-19: 4 mg via INTRAVENOUS

## 2018-02-19 MED ORDER — LIDOCAINE 2% (20 MG/ML) 5 ML SYRINGE
INTRAMUSCULAR | Status: AC
  Filled 2018-02-19: qty 15

## 2018-02-19 MED ORDER — DEXMEDETOMIDINE HCL IN NACL 200 MCG/50ML IV SOLN
INTRAVENOUS | Status: AC
  Filled 2018-02-19: qty 100

## 2018-02-19 MED ORDER — OXYCODONE HCL 5 MG PO TABS: 5.0000 mg | ORAL_TABLET | ORAL | Status: DC | PRN

## 2018-02-19 MED ORDER — 0.9 % SODIUM CHLORIDE (POUR BTL) OPTIME
TOPICAL | Status: DC | PRN
  Administered 2018-02-19: 1000 mL

## 2018-02-19 MED ORDER — PROPOFOL 10 MG/ML IV BOLUS
INTRAVENOUS | Status: AC
  Filled 2018-02-19: qty 20

## 2018-02-19 MED ORDER — GABAPENTIN 300 MG PO CAPS
ORAL_CAPSULE | ORAL | Status: AC
  Filled 2018-02-19: qty 1

## 2018-02-19 MED ORDER — BUPIVACAINE-EPINEPHRINE 0.25% -1:200000 IJ SOLN
INTRAMUSCULAR | Status: DC | PRN
  Administered 2018-02-19 (×2): 10 mL

## 2018-02-19 MED ORDER — LACTATED RINGERS IV SOLN
INTRAVENOUS | Status: DC
  Administered 2018-02-19 (×2): via INTRAVENOUS

## 2018-02-19 MED ORDER — MEPERIDINE HCL 50 MG/ML IJ SOLN: 6.2500 mg | INTRAMUSCULAR | Status: DC | PRN

## 2018-02-19 MED ORDER — LIDOCAINE 2% (20 MG/ML) 5 ML SYRINGE
INTRAMUSCULAR | Status: DC | PRN
  Administered 2018-02-19: 60 mg via INTRAVENOUS

## 2018-02-19 MED ORDER — EPHEDRINE 5 MG/ML INJ
INTRAVENOUS | Status: AC
  Filled 2018-02-19: qty 10

## 2018-02-19 MED ORDER — MORPHINE SULFATE (PF) 2 MG/ML IV SOLN: 1.0000 mg | INTRAVENOUS | Status: DC | PRN

## 2018-02-19 MED ORDER — EPHEDRINE SULFATE-NACL 50-0.9 MG/10ML-% IV SOSY
PREFILLED_SYRINGE | INTRAVENOUS | Status: DC | PRN
  Administered 2018-02-19: 5 mg via INTRAVENOUS

## 2018-02-19 MED ORDER — STERILE WATER FOR IRRIGATION IR SOLN
Status: DC | PRN
  Administered 2018-02-19: 300 mL

## 2018-02-19 MED ORDER — ACETAMINOPHEN 500 MG PO TABS: 1000.0000 mg | ORAL_TABLET | ORAL | Status: DC

## 2018-02-19 SURGICAL SUPPLY — 32 items
APPLIER CLIP 5 13 M/L LIGAMAX5 (MISCELLANEOUS) ×3
CANISTER SUCT 3000ML PPV (MISCELLANEOUS) ×3 IMPLANT
CHLORAPREP W/TINT 26ML (MISCELLANEOUS) ×3 IMPLANT
CLIP APPLIE 5 13 M/L LIGAMAX5 (MISCELLANEOUS) ×1 IMPLANT
COVER SURGICAL LIGHT HANDLE (MISCELLANEOUS) ×3 IMPLANT
COVER WAND RF STERILE (DRAPES) IMPLANT
DERMABOND ADVANCED (GAUZE/BANDAGES/DRESSINGS) ×2
DERMABOND ADVANCED .7 DNX12 (GAUZE/BANDAGES/DRESSINGS) ×1 IMPLANT
ELECT REM PT RETURN 9FT ADLT (ELECTROSURGICAL) ×3
ELECTRODE REM PT RTRN 9FT ADLT (ELECTROSURGICAL) ×1 IMPLANT
GLOVE SURG SIGNA 7.5 PF LTX (GLOVE) ×3 IMPLANT
GOWN STRL REUS W/ TWL LRG LVL3 (GOWN DISPOSABLE) ×2 IMPLANT
GOWN STRL REUS W/ TWL XL LVL3 (GOWN DISPOSABLE) ×1 IMPLANT
GOWN STRL REUS W/TWL LRG LVL3 (GOWN DISPOSABLE) ×4
GOWN STRL REUS W/TWL XL LVL3 (GOWN DISPOSABLE) ×2
KIT BASIN OR (CUSTOM PROCEDURE TRAY) ×3 IMPLANT
KIT TURNOVER KIT B (KITS) ×3 IMPLANT
NS IRRIG 1000ML POUR BTL (IV SOLUTION) ×3 IMPLANT
PAD ARMBOARD 7.5X6 YLW CONV (MISCELLANEOUS) ×6 IMPLANT
POUCH SPECIMEN RETRIEVAL 10MM (ENDOMECHANICALS) ×3 IMPLANT
SCISSORS LAP 5X35 DISP (ENDOMECHANICALS) ×3 IMPLANT
SET IRRIG TUBING LAPAROSCOPIC (IRRIGATION / IRRIGATOR) ×3 IMPLANT
SET TUBE SMOKE EVAC HIGH FLOW (TUBING) ×3 IMPLANT
SLEEVE ENDOPATH XCEL 5M (ENDOMECHANICALS) ×6 IMPLANT
SPECIMEN JAR SMALL (MISCELLANEOUS) ×3 IMPLANT
SUT MNCRL AB 4-0 PS2 18 (SUTURE) ×3 IMPLANT
TOWEL OR 17X24 6PK STRL BLUE (TOWEL DISPOSABLE) ×3 IMPLANT
TOWEL OR 17X26 10 PK STRL BLUE (TOWEL DISPOSABLE) IMPLANT
TRAY LAPAROSCOPIC MC (CUSTOM PROCEDURE TRAY) ×3 IMPLANT
TROCAR XCEL BLUNT TIP 100MML (ENDOMECHANICALS) ×3 IMPLANT
TROCAR XCEL NON-BLD 5MMX100MML (ENDOMECHANICALS) ×3 IMPLANT
WATER STERILE IRR 1000ML POUR (IV SOLUTION) ×3 IMPLANT

## 2018-02-19 NOTE — Progress Notes (Signed)
Patient ID: Nathaniel Jimenez, male   DOB: 1990-05-10, 28 y.o.   MRN: [id number]   Doing very well post op No complaints Tolerating po  Will d/c home

## 2018-02-19 NOTE — Anesthesia Procedure Notes (Signed)
Procedure Name: Intubation Date/Time: 02/19/2018 9:49 AM Performed by: Gwyndolyn Saxon, CRNA Pre-anesthesia Checklist: Patient identified, Emergency Drugs available, Suction available and Patient being monitored Patient Re-evaluated:Patient Re-evaluated prior to induction Oxygen Delivery Method: Circle system utilized Preoxygenation: Pre-oxygenation with 100% oxygen Induction Type: IV induction Ventilation: Mask ventilation without difficulty Laryngoscope Size: Mac and 3 Grade View: Grade I Tube type: Oral Tube size: 6.5 mm Number of attempts: 1 Airway Equipment and Method: Stylet Placement Confirmation: ETT inserted through vocal cords under direct vision,  positive ETCO2 and breath sounds checked- equal and bilateral Secured at: 19 cm Tube secured with: Tape Dental Injury: Teeth and Oropharynx as per pre-operative assessment

## 2018-02-19 NOTE — Discharge Instructions (Signed)
[facility] LAPAROS[facility]: POST OP INSTRUCTIONS Always review your discharge instruction sheet given to you by the facility where your surgery was performed. IF YOU HAVE DISABILITY OR FAMILY LEAVE FORMS, YOU MUST BRING THEM TO THE OFFICE FOR PROCESSING.   DO NOT GIVE THEM TO YOUR DOCTOR.  1. A prescription for pain medication may be given to you upon discharge.  Take your pain medication as prescribed, if needed.  If narcotic pain medicine is not needed, then you may take acetaminophen (Tylenol) or ibuprofen (Advil) as needed. 2. Take your usually prescribed medications unless otherwise directed. 3. If you need a refill on your pain medication, please contact your pharmacy.  They will contact our office to request authorization. Prescriptions will not be filled after 5pm or on week-ends. 4. You should follow a light diet the first few days after arrival home, such as soup and crackers, etc.  Be sure to include lots of fluids daily. 5. Most patients will experience some swelling and bruising in the area of the incisions.  Ice packs will help.  Swelling and bruising can take several days to resolve.  6. It is common to experience some constipation if taking pain medication after surgery.  Increasing fluid intake and taking a stool softener (such as Colace) will usually help or prevent this problem from occurring.  A mild laxative (Milk of Magnesia or Miralax) should be taken according to package instructions if there are no bowel movements after 48 hours. 7. Unless discharge instructions indicate otherwise, you may remove your bandages 24-48 hours after surgery, and you may shower at that time.  You may have steri-strips (small skin tapes) in place directly over the incision.  These strips should be left on the skin for 7-10 days.  If your surgeon used skin glue on the incision, you may shower in 24 hours.  The glue will flake off over the next 2-3 weeks.  Any sutures or  staples will be removed at the office during your follow-up visit. 8. ACTIVITIES:  You may resume regular (light) daily activities beginning the next day--such as daily self-care, walking, climbing stairs--gradually increasing activities as tolerated.  You may have sexual intercourse when it is comfortable.  Refrain from any heavy lifting or straining until approved by your doctor. a. You may drive when you are no longer taking prescription pain medication, you can comfortably wear a seatbelt, and you can safely maneuver your car and apply brakes. b. RETURN TO WORK:  __________________________________________________________ 9. You should see your doctor in the office for a follow-up appointment approximately 2-3 weeks after your surgery.  Make sure that you call for this appointment within a day or two after you arrive home to insure a convenient appointment time. 10. OTHER INSTRUCTIONS:OK TO SHOWER STARTING TOMORROW 11. ICE PACK, TYLENOL, IBUPROFEN FOR PAIN 12. NO HEAVY LIFTING FOR 2 WEEKS __________________________________________________________________________________________________________________________ __________________________________________________________________________________________________________________________ WHEN TO CALL YOUR DOCTOR: 1. Fever over 101.0 2. Inability to urinate 3. Continued bleeding from incision. 4. Increased pain, redness, or drainage from the incision. 5. Increasing abdominal pain  The clinic staff is available to answer your questions during regular business hours.  Please dont hesitate to call and ask to speak to one of the nurses for clinical concerns.  If you have a medical emergency, go to the nearest emergency room or call 911.  A surgeon from [facility] is always on call at the hospital. [address], [place], [place]  [address] ?  [id number], [place], [place]   [address] [phone] ? [phone] ? FAX [phone]  [phone] Web site: [email or web address]

## 2018-02-19 NOTE — Interval H&P Note (Signed)
History and Physical Interval Note: no change in H and P  02/19/2018 9:09 AM  Nathaniel Jimenez  has presented today for surgery, with the diagnosis of symptomatic gallstones  The various methods of treatment have been discussed with the patient and family. After consideration of risks, benefits and other options for treatment, the patient has consented to  Procedure(s): LAPAROSCOPIC CHOLECYSTECTOMY (N/A) as a surgical intervention .  The patient's history has been reviewed, patient examined, no change in status, stable for surgery.  I have reviewed the patient's chart and labs.  Questions were answered to the patient's satisfaction.     Abigail Miyamoto

## 2018-02-19 NOTE — Progress Notes (Signed)
Surgeon made aware that patient refusing to take ERAS medications (tylenol and gabapentin).

## 2018-02-19 NOTE — Discharge Summary (Signed)
Physician Discharge Summary  Patient ID: Nathaniel Jimenez MRN: [id number] DOB/AGE: 08/17/1990 28 y.o.  Admit date: 02/19/2018 Discharge date: 02/19/2018  Admission Diagnoses:  Discharge Diagnoses:  Active Problems:   S/P laparoscopic cholecystectomy   Discharged Condition: good  Hospital Course: Uneventful recover.  Discharged home the day of surgery  Consults: None  Significant Diagnostic Studies:   Treatments: surgery: laparoscopic cholecystectomy  Discharge Exam: Blood pressure (!) 82/40, pulse 62, temperature (!) 97.1 F (36.2 C), temperature source Axillary, resp. rate 18, height 5' (1.524 m), weight 45.4 kg, SpO2 90 %. General appearance: alert and cooperative Resp: clear to auscultation bilaterally Cardio: regular rate and rhythm, S1, S2 normal, no murmur, click, rub or gallop Incision/Wound:abdomen soft, dressings dry  Disposition: Discharge disposition: 01-Home or Self Care        Allergies as of 02/19/2018   No Known Allergies     Medication List    TAKE these medications   amoxicillin 500 MG capsule Commonly known as:  AMOXIL Take 2,000 mg by mouth See admin instructions. Take 2000 mg 1 hour prior to dental work   traMADol 50 MG tablet Commonly known as:  ULTRAM Take 1 tablet (50 mg total) by mouth every 6 (six) hours as needed (mild pain).      Follow-up Information    Abigail Miyamoto, MD. Schedule an appointment as soon as possible for a visit in 3 week(s).   Specialty:  General Surgery Contact information: [address] [place] [address] [phone]           Signed: Abigail Miyamoto 02/19/2018, 3:08 PM

## 2018-02-19 NOTE — Anesthesia Postprocedure Evaluation (Signed)
Anesthesia Post Note  Patient: [name]  Procedure(s) Performed: LAPAROSCOPIC CHOLECYSTECTOMY (N/A Abdomen)     Patient location during evaluation: PACU Anesthesia Type: General Level of consciousness: awake and alert Pain management: pain level controlled Vital Signs Assessment: post-procedure vital signs reviewed and stable Respiratory status: spontaneous breathing, nonlabored ventilation, respiratory function stable and patient connected to nasal cannula oxygen Cardiovascular status: blood pressure returned to baseline and stable Postop Assessment: no apparent nausea or vomiting Anesthetic complications: no    Last Vitals:  Vitals:   02/19/18 1056 02/19/18 1137  BP: (!) 88/48 (!) 82/40  Pulse: (!) 58 62  Resp: 20 18  Temp:  (!) 36.2 C  SpO2: 99% 90%    Last Pain:  Vitals:   02/19/18 1137  TempSrc: Axillary                 Phillips Grout

## 2018-02-19 NOTE — Op Note (Signed)

## 2018-02-19 NOTE — Transfer of Care (Signed)
Immediate Anesthesia Transfer of Care Note  Patient: Nathaniel Jimenez  Procedure(s) Performed: LAPAROSCOPIC CHOLECYSTECTOMY (N/A Abdomen)  Patient Location: PACU  Anesthesia Type:General  Level of Consciousness: drowsy  Airway & Oxygen Therapy: Patient Spontanous Breathing  Post-op Assessment: Report given to RN and Post -op Vital signs reviewed and stable  Post vital signs: Reviewed and stable  Last Vitals:  Vitals Value Taken Time  BP 99/42 02/19/2018 10:40 AM  Temp 36.3 C 02/19/2018 10:40 AM  Pulse 77 02/19/2018 10:53 AM  Resp 18 02/19/2018 10:53 AM  SpO2 100 % 02/19/2018 10:53 AM  Vitals shown include unvalidated device data.  Last Pain: There were no vitals filed for this visit.       Complications: No apparent anesthesia complications

## 2018-02-20 ENCOUNTER — Encounter (HOSPITAL_COMMUNITY): Payer: Self-pay | Admitting: Surgery

## 2018-03-17 DIAGNOSIS — R32 Unspecified urinary incontinence: Secondary | ICD-10-CM | POA: Diagnosis not present

## 2018-03-27 DIAGNOSIS — R32 Unspecified urinary incontinence: Secondary | ICD-10-CM | POA: Diagnosis not present

## 2018-04-14 DIAGNOSIS — R32 Unspecified urinary incontinence: Secondary | ICD-10-CM | POA: Diagnosis not present

## 2018-04-28 DIAGNOSIS — R32 Unspecified urinary incontinence: Secondary | ICD-10-CM | POA: Diagnosis not present

## 2018-11-24 NOTE — Progress Notes (Signed)
Virtual Visit via Video Note  I connected with@ on 11/25/18 at 10:00 AM EST by a video enabled telemedicine application and verified that I am speaking with the correct person using two identifiers. Location patient: home Location provider:work  office Persons participating in the virtual visit: patient, provider  W[place]th national recommendations  regarding COVID 19 pandemic   video visit is advised over in office visit for this patient.  Patient aware  of the limitations of evaluation and management by telemedicine and  availability of in person appointments. and agreed to proceed.   HPI: Nathaniel Jimenez  presents for video visit  Had Downs syndrome and presents today with his parents. As he is non verbal .  They were concerned about recurrence and progression of his GI symptoms.  His general exam was December 2019. He is had continued GI symptoms that had improved after his cholecystectomy in January 2019 but then has returned and is problematic. According to parents he has had GI evaluations in the past and no one can come up with the right answer. Description of are ongoing constipation off and on since he was young  But he has intermittent with " vomiting" after eating. Description may be he may eat something but has to be the right size in the right texture and then may drink a glass of water and bends over and regurgitates.  No projectile vomiting seems to be almost self controlled in the past some of the behavior was felt to be related to his neurologic condition Down's and atypical stereotypic behavior.  He has never had an upper endoscopy because was told that he would not tolerate that. And rx like gerd . Mom has some concerned that he could have migraines because he will grit his teeth and get stomach symptoms. He did have some GI evaluation a few years ago with an esophagram. He had a cholecystectomy in January 2019 with some improvement of some symptoms. Apparently weight is stable  his weight remains steady at 105. He has been followed by cardiology with no new symptoms or active disease.  He has a follow-up appointment with the [facility] cardiologist routine.  [place] as they moved to [place] time in 2016.   ROS: See pertinent positives and negatives per HPI.  Occasional mild snoring mom thinks he could have sleep apnea but no sounds he was under anesthesia for cholecystectomy and no one was concerned about sleep apnea.  Went smoothly.  Sees a dentist on a regular basis no concerns.  Past Medical History:  Diagnosis Date  . Autism   . Autism   . Down syndrome   . Down syndrome   . Heart murmur   . Heart murmur   . Vomiting 02/08/2015    Past Surgical History:  Procedure Laterality Date  . CARDIAC SURGERY     x2 - One at 3 months and one at 15 months  . CHOLECYSTECTOMY  02/19/2018  . CHOLECYSTECTOMY N/A 02/19/2018   Procedure: LAPAROSCOPIC CHOLECYSTECTOMY;  Surgeon: Abigail Miyamoto, MD;  Location: [facility] OR;  Service: General;  Laterality: N/A;  . DENTAL SURGERY      Family History  Problem Relation Age of Onset  . Healthy Mother   . Healthy Father   . Cancer Maternal Grandmother   . Bladder Cancer Maternal Grandfather   . Healthy Paternal Grandmother   . Healthy Paternal Grandfather     Social History   Tobacco Use  . Smoking status: Never Smoker  .  Smokeless tobacco: Never Used  Substance Use Topics  . Alcohol use: No    Alcohol/week: 0.0 standard drinks  . Drug use: No      Current Outpatient Medications:  .  amoxicillin (AMOXIL) 500 MG capsule, Take 2,000 mg by mouth See admin instructions. Take 2000 mg 1 hour prior to dental work, Disp: , Rfl:  .  traMADol (ULTRAM) 50 MG tablet, Take 1 tablet (50 mg total) by mouth every 6 (six) hours as needed (mild pain)., Disp: 20 tablet, Rfl: 0  EXAM: BP Readings from Last 3 Encounters:  02/19/18 (!) 82/40  02/16/18 (!) 111/58  02/04/18 118/62   Wt Readings from Last  3 Encounters:  02/19/18 100 lb (45.4 kg)  02/16/18 100 lb (45.4 kg)  02/04/18 97 lb 8 oz (44.2 kg)     VITALS per patient if applicable:NA no fever weight 105   GENERAL: alert, oriented, appears well and in no acute distress  NE done  Lab Results  Component Value Date   WBC 3.9 (L) 02/16/2018   HGB 16.4 02/16/2018   HCT 46.9 02/16/2018   PLT 163 02/16/2018   GLUCOSE 73 02/16/2018   CHOL 136 11/08/2016   TRIG 77.0 11/08/2016   HDL 37.80 (L) 11/08/2016   LDLCALC 83 11/08/2016   ALT 31 12/26/2017   AST 24 12/26/2017   NA 139 02/16/2018   K 4.1 02/16/2018   CL 103 02/16/2018   CREATININE 1.04 02/16/2018   BUN 16 02/16/2018   CO2 28 02/16/2018   TSH 2.13 12/26/2017  esophagram showed IMPRESSION: 1. Patulous gastroesophageal junction. Minimal appreciable peristalsis in the otherwise normal appearing esophagus. 2. Multiple gallstones. 3. Wilmer was unable to swallow the barium tablet whole.   Electronically Signed   By: Lorriane Shire M.D.   On: 03/06/2015 09:03 ASSESSMENT AND PLAN:  Discussed the following assessment and plan:    ICD-10-CM   1. Regurgitation of food  R11.10   2. Vomiting, intractability of vomiting not specified, presence of nausea not specified, unspecified vomiting type  R11.10   3. GI symptom  R19.8   4. Down syndrome  Q90.9   5. S/P atrioventricular septal defect repair  Z87.74     Poss post  Choley   had partial improvement but ongoing symptoms.  These are highly suspicious for motility problems which would go also with constipation.  He sounds like he have some rumination also based on mom's description.  I suggest GI referral to possible  tertiary center that can help evaluate for possible motility dysfunction and intervention. Less likely EOE  He gets  cv care from [facility] cards   Also consider speech therapist swallowing study although he has no history of aspiration. Counseled.   Expectant management and discussion of plan and treatment  with opportunity to ask questions and all were answered. The patient agreed with the plan and demonstrated an understanding of the instructions.   Advised to call back or seek an in-person evaluation if worsening  or having  further concerns .   Shanon Ace, MD

## 2018-11-25 ENCOUNTER — Other Ambulatory Visit: Payer: Self-pay

## 2018-11-25 ENCOUNTER — Telehealth (INDEPENDENT_AMBULATORY_CARE_PROVIDER_SITE_OTHER): Payer: 59 | Admitting: Internal Medicine

## 2018-11-25 ENCOUNTER — Encounter: Payer: Self-pay | Admitting: Internal Medicine

## 2018-11-25 DIAGNOSIS — Z8774 Personal history of (corrected) congenital malformations of heart and circulatory system: Secondary | ICD-10-CM | POA: Diagnosis not present

## 2018-11-25 DIAGNOSIS — R1110 Vomiting, unspecified: Secondary | ICD-10-CM

## 2018-11-25 DIAGNOSIS — Q909 Down syndrome, unspecified: Secondary | ICD-10-CM | POA: Diagnosis not present

## 2018-11-25 DIAGNOSIS — R198 Other specified symptoms and signs involving the digestive system and abdomen: Secondary | ICD-10-CM | POA: Diagnosis not present

## 2018-12-07 ENCOUNTER — Other Ambulatory Visit: Payer: Self-pay

## 2018-12-07 ENCOUNTER — Telehealth (INDEPENDENT_AMBULATORY_CARE_PROVIDER_SITE_OTHER): Payer: 59 | Admitting: Family Medicine

## 2018-12-07 ENCOUNTER — Encounter: Payer: Self-pay | Admitting: Family Medicine

## 2018-12-07 DIAGNOSIS — R21 Rash and other nonspecific skin eruption: Secondary | ICD-10-CM

## 2018-12-07 NOTE — Progress Notes (Signed)
Virtual Visit via Video Note  I connected with the patient on 12/07/18 at  4:00 PM EST by a video enabled telemedicine application and verified that I am speaking with the correct person using two identifiers.  Location patient: home Location provider:work or home office Persons participating in the virtual visit: patient, provider  I discussed the limitations of evaluation and management by telemedicine and the availability of in person appointments. The patient expressed understanding and agreed to proceed.   HPI: Here with both parents for a rash on the chest and I[[er arms that started yesterday. It does not seem to itch or bother him in any way. No other symptoms, no headache or ST or cough. No NVD. He is eating and drinking normally. He has not been around anyone for several weeks except he did see the [facility]ologist on 12-03-18. Part of that visit was for an ECHO.    ROS: See pertinent positives and negatives per HPI.  Past Medical History:  Diagnosis Date  . Autism   . Autism   . Down syndrome   . Down syndrome   . Heart murmur   . Heart murmur   . Vomiting 02/08/2015    Past Surgical History:  Procedure Laterality Date  . CARDIAC SURGERY     x2 - One at 3 months and one at 15 months  . CHOLECYSTECTOMY  02/19/2018  . CHOLECYSTECTOMY N/A 02/19/2018   Procedure: LAPAROSCOPIC CHOLECYSTECTOMY;  Surgeon: Abigail Miyamoto, MD;  Location: [facility] OR;  Service: General;  Laterality: N/A;  . DENTAL SURGERY      Family History  Problem Relation Age of Onset  . Healthy Mother   . Healthy Father   . Cancer Maternal Grandmother   . Bladder Cancer Maternal Grandfather   . Healthy Paternal Grandmother   . Healthy Paternal Grandfather      Current Outpatient Medications:  .  amoxicillin (AMOXIL) 500 MG capsule, Take 2,000 mg by mouth See admin instructions. Take 2000 mg 1 hour prior to dental work, Disp: , Rfl:  .  traMADol (ULTRAM) 50 MG tablet, Take 1 tablet (50 mg  total) by mouth every 6 (six) hours as needed (mild pain)., Disp: 20 tablet, Rfl: 0  EXAM: SKIN: the chest and both upper arms have patches of isolated red papules.  VITALS per patient if applicable:  GENERAL: alert, oriented, appears well and in no acute distress  HEENT: atraumatic, conjunttiva clear, no obvious abnormalities on inspection of external nose and ears  NECK: normal movements of the head and neck  LUNGS: on inspection no signs of respiratory distress, breathing rate appears normal, no obvious gross SOB, gasping or wheezing  CV: no obvious cyanosis  MS: moves all visible extremities without noticeable abnormality  PSYCH/NEURO: pleasant and cooperative, no obvious depression or anxiety, speech and thought processing grossly intact  ASSESSMENT AND PLAN: Rash of uncertain etiology, likely allergic or viral. He seems to be fine in all other ways. They can give him Zyrtec as needed and apply 1% hydrocortisone cream OTC as needed. They will follow up if anything changes.  Gershon Crane, MD  Discussed the following assessment and plan:  No diagnosis found.     I discussed the assessment and treatment plan with the patient. The patient was provided an opportunity to ask questions and all were answered. The patient agreed with the plan and demonstrated an understanding of the instructions.   The patient was advised to call back or seek an in-person evaluation if  the symptoms worsen or if the condition fails to improve as anticipated.

## 2018-12-08 ENCOUNTER — Telehealth: Payer: Self-pay | Admitting: *Deleted

## 2018-12-08 NOTE — Telephone Encounter (Signed)
Pt saw dr.fry virtually on 12/07/18 please advise

## 2018-12-08 NOTE — Telephone Encounter (Signed)
Copied from [place] [id number]. Topic: General - Inquiry >> Dec 08, 2018  2:40 PM Richardo Priest, [place] wrote: Reason for CRM: Pt's mother called in stating she feels like the rash is not getting any better. Pt's mother would like to know if something different can be called in. Please advise and call back.

## 2018-12-09 MED ORDER — METHYLPREDNISOLONE 4 MG PO TBPK: ORAL_TABLET | ORAL | 0 refills | Status: DC

## 2018-12-09 NOTE — Addendum Note (Signed)
Addended by: Gwenyth Ober R on: 12/09/2018 08:28 AM   Modules accepted: Orders

## 2018-12-09 NOTE — Telephone Encounter (Signed)
Call in a Medrol dose pack  

## 2018-12-09 NOTE — Telephone Encounter (Signed)
See dr.fry message

## 2018-12-09 NOTE — Telephone Encounter (Signed)
Pt mother notified of update. Pt mother stated that pt has a hard time swallowing and wants to know if medication could be chopped up and put it applesauce. I advised pt per Dr.Fry annotation " that would be a question for the pharmacist, I don't see why it couldn't since it is not a delayed release tablet. Pt mother should still ask the pharmacist". Pt mother verbalized understanding.

## 2018-12-16 ENCOUNTER — Encounter: Payer: Self-pay | Admitting: Internal Medicine

## 2018-12-16 NOTE — Progress Notes (Signed)
Spoke with pt mother and told her info

## 2018-12-22 IMAGING — CR DG CHEST 2V
2 series · 2 of 2 positions shown · non-contrast
Comparison: None.

CLINICAL DATA: Possible choking on apple.

EXAM:
CHEST  2 VIEW

[w chest pa]
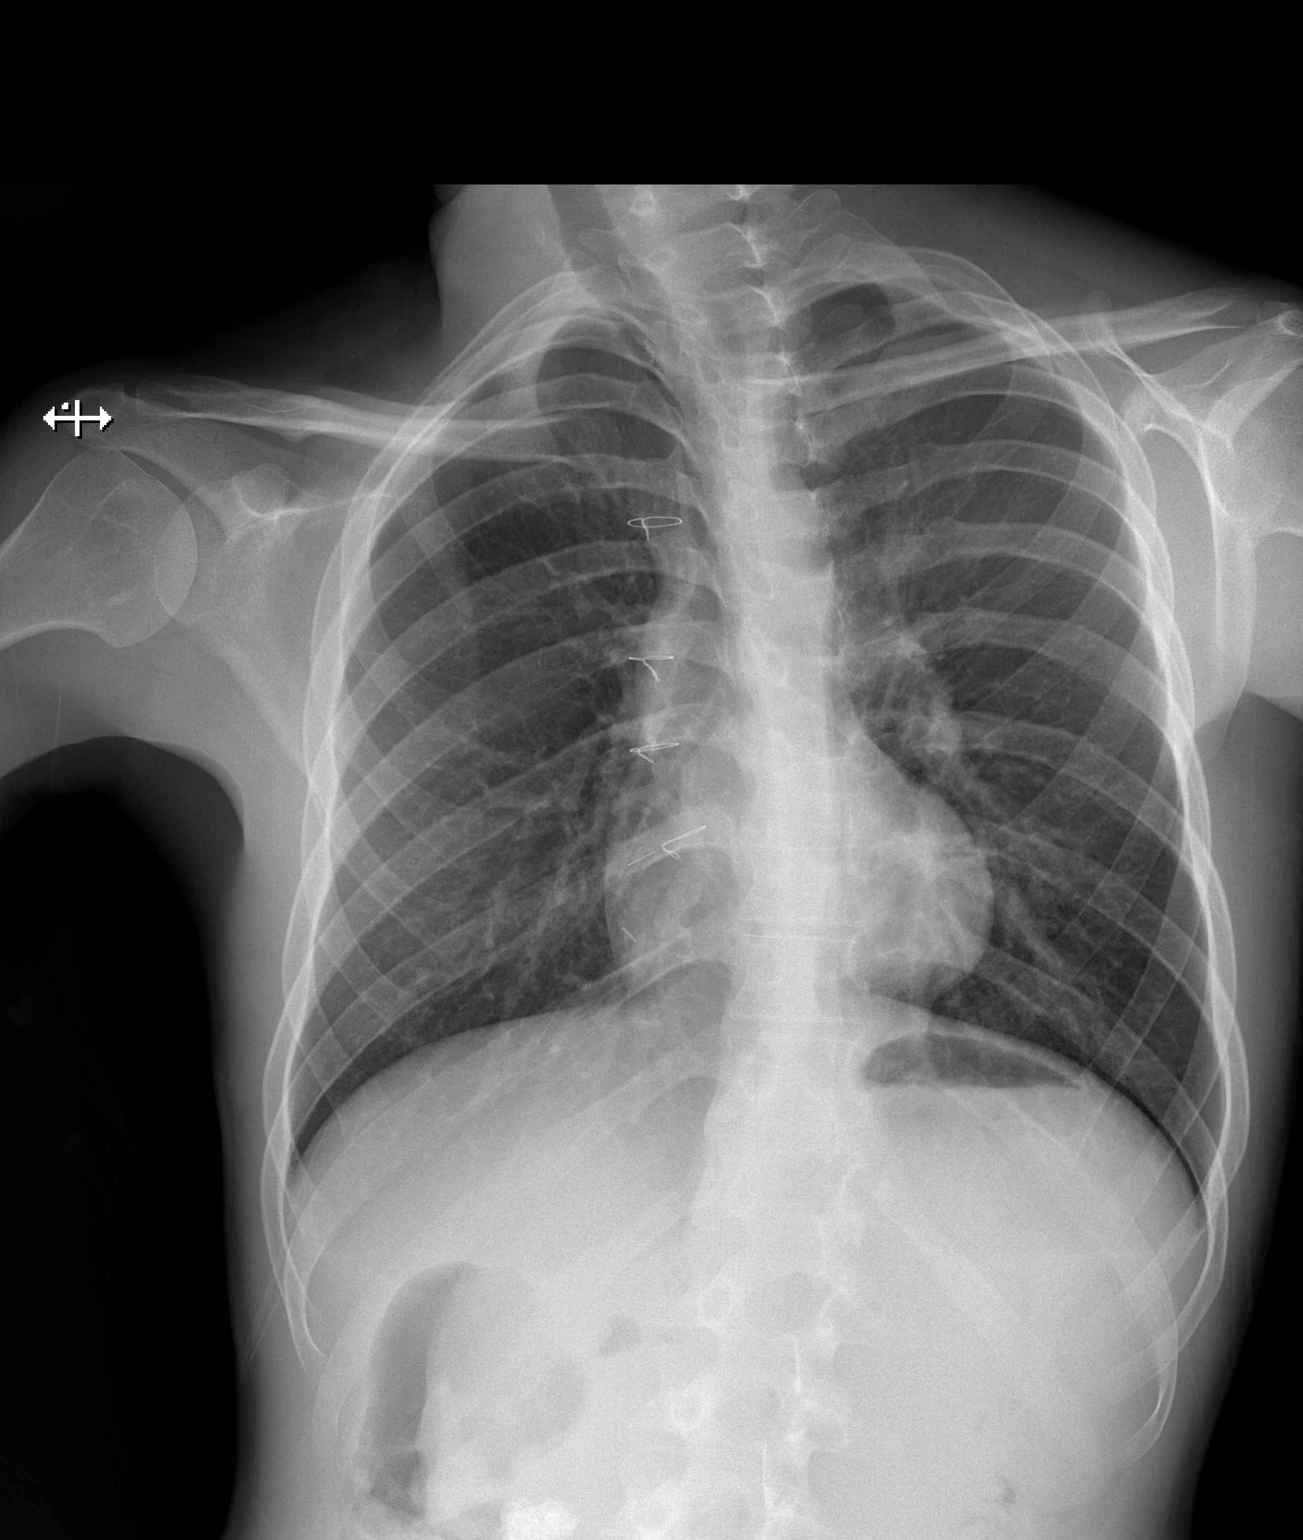

[w chest lat]
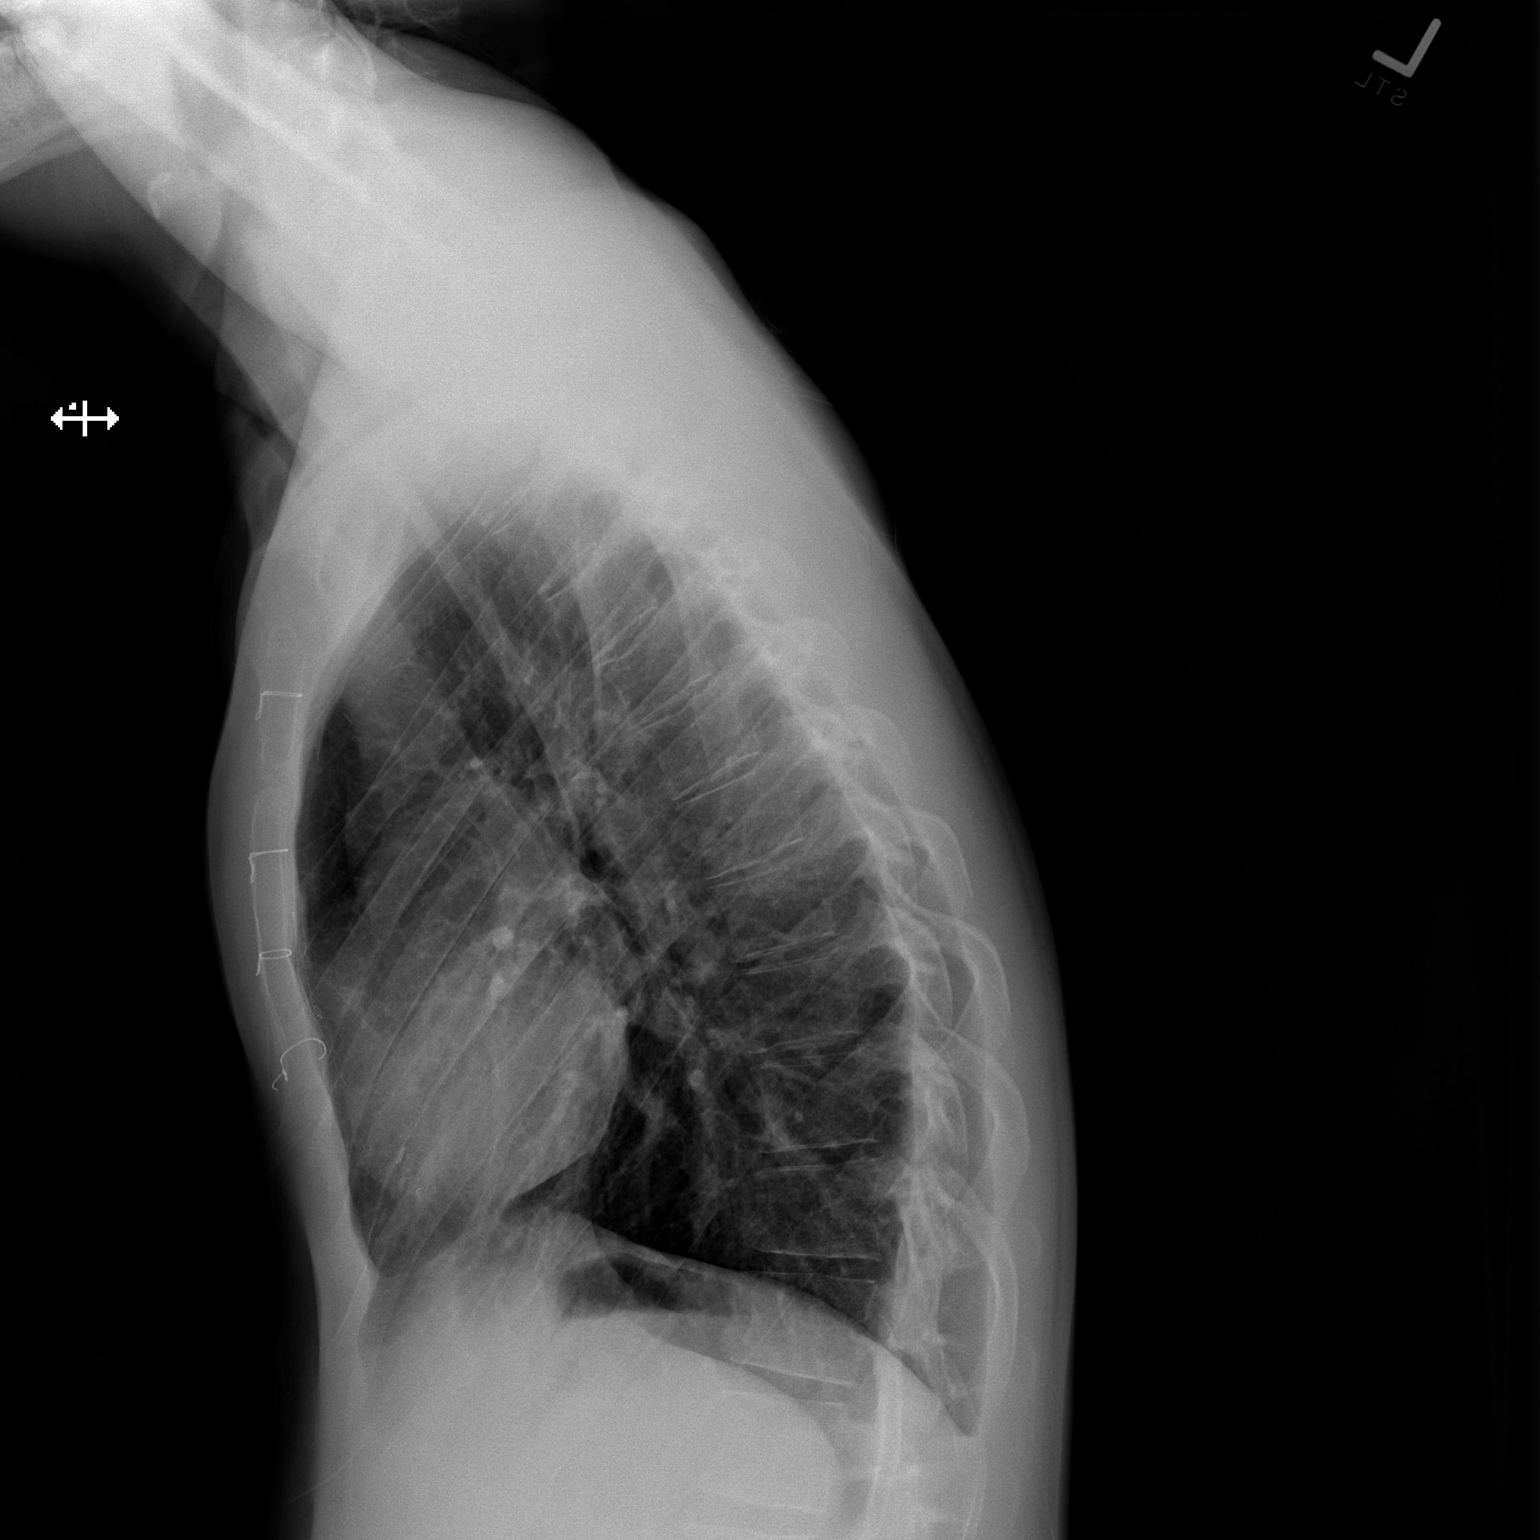

[2 of 2 positions shown; findings below may reference images not displayed]

FINDINGS: Sternotomy wires identified. The heart, hila, and mediastinum are
normal. No pneumothorax. No nodules or masses. No focal infiltrate.
No acute abnormality identified.
IMPRESSION: No active cardiopulmonary disease.

## 2019-01-29 ENCOUNTER — Ambulatory Visit: Payer: 59 | Admitting: Internal Medicine

## 2019-02-26 ENCOUNTER — Telehealth: Payer: Self-pay | Admitting: Internal Medicine

## 2019-02-26 NOTE — Telephone Encounter (Signed)
I told Dad that would be fine.

## 2019-03-02 ENCOUNTER — Encounter: Payer: Self-pay | Admitting: Internal Medicine

## 2019-03-02 ENCOUNTER — Ambulatory Visit (INDEPENDENT_AMBULATORY_CARE_PROVIDER_SITE_OTHER): Payer: Medicaid Other | Admitting: Internal Medicine

## 2019-03-02 ENCOUNTER — Other Ambulatory Visit: Payer: Self-pay

## 2019-03-02 VITALS — BP 90/60 | HR 76 | Temp 98.8°F | Ht <= 58 in | Wt 101.0 lb

## 2019-03-02 DIAGNOSIS — K5909 Other constipation: Secondary | ICD-10-CM

## 2019-03-02 DIAGNOSIS — Q909 Down syndrome, unspecified: Secondary | ICD-10-CM | POA: Diagnosis not present

## 2019-03-02 DIAGNOSIS — F840 Autistic disorder: Secondary | ICD-10-CM

## 2019-03-02 DIAGNOSIS — R1110 Vomiting, unspecified: Secondary | ICD-10-CM

## 2019-03-02 NOTE — Patient Instructions (Signed)
Take Miralax daily and see if that helps your symptoms.  My Chart [place] with an update.   Dr Leone Payor is going to check on treatment options.   I appreciate the opportunity to care for you. Stan Head, MD, [facility]

## 2019-03-02 NOTE — Progress Notes (Signed)
Braidan Ricciardi 29 y.o. Sep 12, 1990 [id number]  Assessment & Plan:   Encounter Diagnoses  Name Primary?  . 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 Chronic constipation   . Down syndrome   . Autism     He appears to have rumination syndrome as best I can tell. I do not think further w/u needed in this case.  In normal people cognitive based behavioral therapy and diaphragmatic breathing techniques are used for treatment.  That simply not an option here.  Self treatment with chewing on a hand towel seems to help, interestingly there are some studies suggesting chewing gum will help so seems like that is similar.  We had a good discussion about this today - Mom and Dad reassured that to the best of my knowledge this is not harmful.  Subsequent to the visit I did learn that tooth decay is a potential problem with this.   They think he is less affected when not constipated. I have explained that I believe it is safe to use Miralax more regularly so the will do that  He has a good diet it seems and I do not think he needs vitamin supplementation  I will contact mom and dad about the potential to use chewing gum, and at least mention the tooth decay is an issue but again we are limited in our options here to treat.  There was one interesting but impractical study that showed placement of gastrostomy tubes and instillation of noxious substances like capsaicin and N-acetylcysteine which eventually reduced regurgitation and rumination in these pediatric patients and reintroduction of oral feeding was successful without rumination.  I appreciate the opportunity to care for this patient. CC: Panosh, Neta Mends, MD     Subjective:   Chief Complaint: regurgitation  HPI Patient is a 29 year old white male with Down syndrome and autism here with both parents because of regurgitation problems that have been chronic.  He had previously seen [place] with complaints in 2017 and a barium  swallow showed a patulous gastroesophageal junction, and minimal appreciable peristalsis and otherwise normal-appearing esophagus.  He had gallstones noted and eventually underwent a cholecystectomy.  His parents report that he has this regurgitation problem and he regurgitates food chews and swallows it.  He does not really vomit.  They are concerned, particularly his dad relates concerns about whether or not this is causing damage.  He suffers with chronic constipation issues "3 bowel movements in a week is a good weekend ".  MiraLAX will help but because of the label limiting it to 2 weeks of use his mom has not used it chronically and I do not think she has had a recommendation from a physician about that.  He eats a good diet that is not a problem, he eats a healthy diet that mom cooks and dad cooks.  Weight is relatively stable I think.  He previously lived in [place] until dad's work brought him here, has had evaluations there but has never had an endoscopy.  His parents note that he will chew on a hand tell and that frequently stops this regurgitation issue.  Mom admits she thinks this is at least somewhat behavioral.  I.e. the regurgitation.  She wonders if he may have headaches at times contributing to increased bruxism and perhaps some of the regurgitation.  With his inability to communicate well it is difficult to know, she admits.  She also wonders if allergies and sinus drainage might contribute.  Symptoms seem to be increased during seasonal allergy times of year.  Things are not as bad as they were after his cholecystectomy.  When he moves his bowels he has less regurgitation they think. No Known Allergies No outpatient medications have been marked as taking for the 03/02/19 encounter (Office Visit) with Gatha Mayer, MD.   Past Medical History:  Diagnosis Date  . Autism   . Down syndrome   . Heart murmur    Past Surgical History:  Procedure Laterality Date  . CARDIAC SURGERY      x2 - One at 3 months and one at 15 months  . CHOLECYSTECTOMY N/A 02/19/2018   Procedure: LAPAROSCOPIC CHOLECYSTECTOMY;  Surgeon: Coralie Keens, MD;  Location: [facility];  Service: General;  Laterality: N/A;  . DENTAL SURGERY     Social History   Social History Narrative   Lives with parents and is non-verbal, autistic and has down syndrome.    Born in [place] had heart surgery yet L lived in [place] outside of [place] before moving to [place] with family. For fathers job Art gallery manager.    Is in [place].      family history includes Bladder Cancer in his maternal grandfather; Cancer in his maternal grandmother; Healthy in his father, mother, paternal grandfather, and paternal grandmother; Osteoarthritis in his maternal grandmother; Osteoporosis in his maternal grandmother.   Review of Systems As above  Objective:   Physical Exam BP 90/60 (BP Location: Left Arm, Patient Position: Sitting, Cuff Size: Normal)   Pulse 76   Temp 98.8 F (37.1 C)   Ht 4\' 10"  (1.473 m) Comment: height measured without shoes  Wt 101 lb (45.8 kg)   BMI 21.11 kg/m  NAD Down's facies Bruxism Rocking movements Lungs cta Nl Cor

## 2019-03-04 ENCOUNTER — Encounter: Payer: Self-pay | Admitting: Internal Medicine

## 2019-04-08 ENCOUNTER — Telehealth: Payer: Self-pay

## 2019-04-08 NOTE — Telephone Encounter (Signed)
Form was fax regarding a PA for pt supplies. PA will be started.

## 2019-05-03 ENCOUNTER — Telehealth: Payer: Self-pay | Admitting: Internal Medicine

## 2019-05-03 NOTE — Telephone Encounter (Signed)
Form signed.

## 2019-05-03 NOTE — Telephone Encounter (Signed)
Lafonda Mosses from [facility] [employer] stated a form, Certificate Medical necessity, was faxed prior and wondering if the PCP received it and will sign it? It is for supplies.  She stated if it was not received to call and she will fax it again.    Phone: [phone] ext [phone] Fax: [phone]

## 2019-05-03 NOTE — Telephone Encounter (Signed)
Form placed in red folder. Please return when complete. Thank you! 

## 2019-05-05 NOTE — Telephone Encounter (Signed)
Form has been faxed to [phone] [facility] and confirmation has been received.

## 2019-05-10 ENCOUNTER — Telehealth: Payer: Self-pay | Admitting: Internal Medicine

## 2019-05-10 NOTE — Telephone Encounter (Signed)
Judeth Cornfield from [facility] stated they faxed over a Medical Necessity Form for continence supplies on 4/14 and checking to see if it has been completed or received?   If it is completed, please fax it to [phone]   If not received call: Phone: [phone] opt 2

## 2019-05-11 NOTE — Telephone Encounter (Signed)
Called [facility] and let them know that I had faxed the number [phone] and I was informed that this was not the correct number and I faxed to [phone] today. The form was confirmed that this was the correct form from [facility] form. Fax confirmed that it went to the correct number.

## 2019-05-13 ENCOUNTER — Telehealth: Payer: Self-pay | Admitting: Internal Medicine

## 2019-05-13 NOTE — Telephone Encounter (Signed)
Pt's father, Theron Arista, dropped off FMLA forms to be completed by the provider. Upon the completion pt would like to be called at [phone] to pick forms up. Placed in providers folder. Thanks

## 2019-05-14 NOTE — Telephone Encounter (Signed)
Form  Reviewed and signed completed   On your desk  Please copy for our record

## 2019-05-14 NOTE — Telephone Encounter (Signed)
Called patient and LMOVM to return call  Left a detailed voice message to let father Theron Arista know that the forms are ready for pick up at the front desk.

## 2019-05-14 NOTE — Telephone Encounter (Signed)
Form and folder placed in red folder. Please return upon completion.

## 2019-12-06 NOTE — Progress Notes (Signed)
Chief Complaint  Patient presents with  . Annual Exam    form  for school and care     HPI: Patient  Nathaniel Jimenez  29 y.o. comes in today for Preventive Health Care visit  With  Both parents today there is a form for his school care that has to be filled yearly No recent change in his health they've been out unable to find specific ankle supports who is wearing hightop shoes.  No recent     Eye check . But seems okay Hearing ok  Sleep   Pretty good mom states that she he could have airway obstruction when he sleeps as he leans forward but he has been doing this for a while  See  Dr Leone Payor note from 2 21 felt to have rumination syndrome does better when he is able to relax after eating different foods with different textures may be helpful.  4 days per week    Pandemic no dx  Has been immunized against Covid     3/ 24  And 4/ 14    Takes walks with family. Has never done flu shot.    Just saw cardiologist thought he was doing well no interventions no symptoms at this time. Has antibiotic prophylaxis when he has dental work.   Health Maintenance  Topic Date Due  . Hepatitis C Screening  Never done  . HIV Screening  Never done  . INFLUENZA VACCINE  Never done  . TETANUS/TDAP  11/09/2026     ROS:  GEN/ HEENT: No fever, significant weight changes sweats headaches vision problems hearing changes, CV/ PULM; No chest pain shortness of breath cough, syncope,edema  change in exercise tolerance. GI /GU: No adominal pain, vomiting, change in bowel habits. No blood in the stool. No significant GU symptoms. SKIN/HEME: ,no acute skin rashes suspicious lesions or bleeding. No lymphadenopathy, nodules, masses.  IMM/ Allergy: No unusual infections.  Allergy .   REST of 12 system review negative except as per HPI   Past Medical History:  Diagnosis Date  . Autism   . Down syndrome   . Heart murmur     Past Surgical History:  Procedure Laterality Date  . CARDIAC SURGERY     x2  - One at 3 months and one at 15 months  . CHOLECYSTECTOMY N/A 02/19/2018   Procedure: LAPAROSCOPIC CHOLECYSTECTOMY;  Surgeon: Abigail Miyamoto, MD;  Location: [facility] OR;  Service: General;  Laterality: N/A;  . DENTAL SURGERY      Family History  Problem Relation Age of Onset  . Healthy Mother   . Healthy Father   . Cancer Maternal Grandmother        Merkle cell, skin cancer  . Osteoarthritis Maternal Grandmother   . Osteoporosis Maternal Grandmother   . Bladder Cancer Maternal Grandfather   . Healthy Paternal Grandmother   . Healthy Paternal Grandfather     Social History   Socioeconomic History  . Marital status: Single    Spouse name: Not on file  . Number of children: 0  . Years of education: Not on file  . Highest education level: Not on file  Occupational History  . Occupation: disabled  Tobacco Use  . Smoking status: Never Smoker  . Smokeless tobacco: Never Used  Vaping Use  . Vaping Use: Never used  Substance and Sexual Activity  . Alcohol use: No    Alcohol/week: 0.0 standard drinks  . Drug use: No  . Sexual activity: Not on file  Other Topics Concern  . Not on file  Social History Narrative   Lives with parents and is non-verbal, autistic and has down syndrome.    Born in [place] had heart surgery yet L lived in [place] to [place] with family. For fathers job Acupuncturistelectrical engineer.    Is in [employer] [facility].      Social Determinants of Health   Financial Resource Strain:   . Difficulty of Paying Living Expenses: Not on file  Food Insecurity:   . Worried About Programme researcher, broadcasting/film/videounning Out of Food in the Last Year: Not on file  . Ran Out of Food in the Last Year: Not on file  Transportation Needs:   . Lack of Transportation (Medical): Not on file  . Lack of Transportation (Non-Medical): Not on file  Physical Activity:   . Days of Exercise per Week: Not on file  . Minutes of Exercise per Session: Not on file  Stress:   .  Feeling of Stress : Not on file  Social Connections:   . Frequency of Communication with Friends and Family: Not on file  . Frequency of Social Gatherings with Friends and Family: Not on file  . Attends Religious Services: Not on file  . Active Member of Clubs or Organizations: Not on file  . Attends BankerClub or Organization Meetings: Not on file  . Marital Status: Not on file    No outpatient medications prior to visit.   No facility-administered medications prior to visit.     EXAM:  BP 106/66   Wt 102 lb (46.3 kg)   BMI 21.32 kg/m   Body mass index is 21.32 kg/m. Wt Readings from Last 3 Encounters:  12/07/19 102 lb (46.3 kg)  03/02/19 101 lb (45.8 kg)  02/19/18 100 lb (45.4 kg)    Physical Exam: Vital signs reviewed ZOX:WRUEGEN:This is a well-developed alert Down's appearing large tongue nonverbal but  cooperative with father    in no acute distress.Marland Kitchen.  HEENT: normocephalic atraumatic , Eyes: PERRL EOM's full, conjunctiva clear, Nares: paten,t no deformity discharge or tenderness., Ears: no deformity EAC's clear TMs with normal landmarks small amount of wax in left ear. Mouth not examined cannot tolerate the mask NECK: supple without masses, thyromegaly or bruits. Short neck CHEST/PULM:  Clear to auscultation and percussion breath sounds equal no wheeze , rales or rhonchi. Well-healed scar chest  CV: PMI is nondisplaced, S1 S2 no gallops, murmurs, rubs. Peripheral pulses are full without delay.No JVD .  ABDOMEN: Bowel sounds normal nontender  No guard or rebound, no hepato splenomegal no CVA tenderness.  No hernia. External GU normal Extremtities:  No clubbing cyanosis or edema, no acute joint swelling or redness no focal atrophy foreshorten fingers downs pronation walks with toeing out left more than right NEURO: Abnormal gait nonverbal but well SKIN: No acute rashes normal turgor, color, no bruising or petechiae. Some dryness hyperemia on hands otherwise no acute rashes LN: no  cervical axillary inguinal adenopathy  Lab Results  Component Value Date   WBC 3.9 (L) 02/16/2018   HGB 16.4 02/16/2018   HCT 46.9 02/16/2018   PLT 163 02/16/2018   GLUCOSE 73 02/16/2018   CHOL 136 11/08/2016   TRIG 77.0 11/08/2016   HDL 37.80 (L) 11/08/2016   LDLCALC 83 11/08/2016   ALT 31 12/26/2017   AST 24 12/26/2017   NA 139 02/16/2018   K 4.1 02/16/2018   CL 103 02/16/2018   CREATININE 1.04 02/16/2018  BUN 16 02/16/2018   CO2 28 02/16/2018   TSH 2.13 12/26/2017    BP Readings from Last 3 Encounters:  12/07/19 106/66  03/02/19 90/60  02/19/18 (!) 82/40    Lab plan reviewed with patient   ASSESSMENT AND PLAN:  Discussed the following assessment and plan:    ICD-10-CM   1. Encounter for general adult medical examination with abnormal findings  Z00.01 Hepatic function panel    CBC with Differential/Platelet    BASIC METABOLIC PANEL WITH GFR    Lipid panel    TSH    T4, free    T4, free    TSH    Lipid panel    BASIC METABOLIC PANEL WITH GFR    CBC with Differential/Platelet    Hepatic function panel  2. Down syndrome  Q90.9 Hepatic function panel    CBC with Differential/Platelet    BASIC METABOLIC PANEL WITH GFR    Lipid panel    TSH    T4, free    T4, free    TSH    Lipid panel    BASIC METABOLIC PANEL WITH GFR    CBC with Differential/Platelet    Hepatic function panel  3. Subaortic stenosis  Q24.4 Hepatic function panel    CBC with Differential/Platelet    BASIC METABOLIC PANEL WITH GFR    Lipid panel    TSH    T4, free    T4, free    TSH    Lipid panel    BASIC METABOLIC PANEL WITH GFR    CBC with Differential/Platelet    Hepatic function panel  4. Adult congenital heart disease  Q24.9 Hepatic function panel    CBC with Differential/Platelet    BASIC METABOLIC PANEL WITH GFR    Lipid panel    TSH    T4, free    T4, free    TSH    Lipid panel    BASIC METABOLIC PANEL WITH GFR    CBC with Differential/Platelet    Hepatic  function panel  5. Elevated MCV  R71.8 Hepatic function panel    CBC with Differential/Platelet    BASIC METABOLIC PANEL WITH GFR    Lipid panel    TSH    T4, free    T4, free    TSH    Lipid panel    BASIC METABOLIC PANEL WITH GFR    CBC with Differential/Platelet    Hepatic function panel  6. 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 Autism  F84.0   Forms completed mom to attach avoidance foods and med list which she is not on at this point We discussed lab monitoring CBC thyroid which is more common in Down's. We'll get these today Did discuss flu shot at least for the family for protection If concerned about stopping sleeping can ask the [facility] team if there is any evaluation is appropriate. It is possible he is positioning himself so his airway is better hard to tell. Glad that   Rumination  Has been controlled  No follow-ups on file.  Patient Care Team:  Randie Bloodgood, Neta Mends, MD as PCP - General (Internal Medicine) Hilton Cork, MD as Referring Physician (Pediatric Cardiology) Patient Instructions  Lab today  Exam looks stable. Consider asking [facility] team about the  Airway when sleeping  .Marland Kitchen Consider getting flu injection.   Yearly check up or as needed,     Neta Mends. Cassidy Tabet M.D.

## 2019-12-07 ENCOUNTER — Ambulatory Visit (INDEPENDENT_AMBULATORY_CARE_PROVIDER_SITE_OTHER): Payer: Medicaid Other | Admitting: Internal Medicine

## 2019-12-07 ENCOUNTER — Other Ambulatory Visit: Payer: Self-pay

## 2019-12-07 ENCOUNTER — Encounter: Payer: Self-pay | Admitting: Internal Medicine

## 2019-12-07 VITALS — BP 106/66 | Wt 102.0 lb

## 2019-12-07 DIAGNOSIS — Z0001 Encounter for general adult medical examination with abnormal findings: Secondary | ICD-10-CM

## 2019-12-07 DIAGNOSIS — Z0000 Encounter for general adult medical examination without abnormal findings: Secondary | ICD-10-CM | POA: Diagnosis not present

## 2019-12-07 DIAGNOSIS — F840 Autistic disorder: Secondary | ICD-10-CM

## 2019-12-07 DIAGNOSIS — R718 Other abnormality of red blood cells: Secondary | ICD-10-CM

## 2019-12-07 DIAGNOSIS — Q244 Congenital subaortic stenosis: Secondary | ICD-10-CM | POA: Diagnosis not present

## 2019-12-07 DIAGNOSIS — Q909 Down syndrome, unspecified: Secondary | ICD-10-CM

## 2019-12-07 DIAGNOSIS — R1110 Vomiting, unspecified: Secondary | ICD-10-CM

## 2019-12-07 DIAGNOSIS — Q249 Congenital malformation of heart, unspecified: Secondary | ICD-10-CM | POA: Diagnosis not present

## 2019-12-07 NOTE — Patient Instructions (Signed)
Lab today  Exam looks stable. Consider asking [facility] team about the  Airway when sleeping  .Marland Kitchen Consider getting flu injection.   Yearly check up or as needed,

## 2019-12-08 LAB — CBC WITH DIFFERENTIAL/PLATELET
Absolute Monocytes: 279 cells/uL (ref 200–950)
Basophils Absolute: 78 cells/uL (ref 0–200)
Basophils Relative: 1.6 %
Eosinophils Absolute: 20 cells/uL (ref 15–500)
Eosinophils Relative: 0.4 %
HCT: 46.3 % (ref 38.5–50.0)
Hemoglobin: 16.3 g/dL (ref 13.2–17.1)
Lymphs Abs: 1009 cells/uL (ref 850–3900)
MCH: 34.2 pg — ABNORMAL HIGH (ref 27.0–33.0)
MCHC: 35.2 g/dL (ref 32.0–36.0)
MCV: 97.1 fL (ref 80.0–100.0)
MPV: 9 fL (ref 7.5–12.5)
Monocytes Relative: 5.7 %
Neutro Abs: 3513 cells/uL (ref 1500–7800)
Neutrophils Relative %: 71.7 %
Platelets: 194 10*3/uL (ref 140–400)
RBC: 4.77 10*6/uL (ref 4.20–5.80)
RDW: 12.5 % (ref 11.0–15.0)
Total Lymphocyte: 20.6 %
WBC: 4.9 10*3/uL (ref 3.8–10.8)

## 2019-12-08 LAB — BASIC METABOLIC PANEL WITH GFR
BUN: 16 mg/dL (ref 7–25)
CO2: 29 mmol/L (ref 20–32)
Calcium: 9.2 mg/dL (ref 8.6–10.3)
Chloride: 103 mmol/L (ref 98–110)
Creat: 0.94 mg/dL (ref 0.60–1.35)
GFR, Est African American: 129 mL/min/{1.73_m2} (ref >=60)
GFR, Est Non African American: 111 mL/min/{1.73_m2} (ref >=60)
Glucose, Bld: 78 mg/dL (ref 65–99)
Potassium: 4 mmol/L (ref 3.5–5.3)
Sodium: 142 mmol/L (ref 135–146)

## 2019-12-08 LAB — TSH: TSH: 1.05 mIU/L (ref 0.40–4.50)

## 2019-12-08 LAB — HEPATIC FUNCTION PANEL
AG Ratio: 1.9 (calc) (ref 1.0–2.5)
ALT: 18 U/L (ref 9–46)
AST: 22 U/L (ref 10–40)
Albumin: 4.4 g/dL (ref 3.6–5.1)
Alkaline phosphatase (APISO): 69 U/L (ref 36–130)
Bilirubin, Direct: 0.3 mg/dL — ABNORMAL HIGH (ref 0.0–0.2)
Globulin: 2.3 g/dL (calc) (ref 1.9–3.7)
Indirect Bilirubin: 1.5 mg/dL (calc) — ABNORMAL HIGH (ref 0.2–1.2)
Total Bilirubin: 1.8 mg/dL — ABNORMAL HIGH (ref 0.2–1.2)
Total Protein: 6.7 g/dL (ref 6.1–8.1)

## 2019-12-08 LAB — LIPID PANEL
Cholesterol: 142 mg/dL (ref <200)
HDL: 37 mg/dL — ABNORMAL LOW (ref >=40)
LDL Cholesterol (Calc): 89 mg/dL (calc)
Non-HDL Cholesterol (Calc): 105 mg/dL (calc) (ref <130)
Total CHOL/HDL Ratio: 3.8 (calc) (ref <5.0)
Triglycerides: 75 mg/dL (ref <150)

## 2019-12-08 LAB — T4, FREE: Free T4: 1.1 ng/dL (ref 0.8–1.8)

## 2019-12-08 NOTE — Progress Notes (Signed)
Lab results   nl thyroid blood sugar   no worrisome findings  .

## 2020-02-18 ENCOUNTER — Telehealth: Payer: Self-pay | Admitting: Internal Medicine

## 2020-02-18 NOTE — Telephone Encounter (Signed)
Hope he is doing ok   Contact family about how he is doing for more inforamtion   usually after 10 days of symptoms  and feeling better for out of isolation   No need to retest  as can be positive for weeks even if not contagious.    Suggest  Virtual visit  If need more information or help woth decision making

## 2020-02-18 NOTE — Telephone Encounter (Signed)
Patient's mother is calling with concerns regarding Covid.  Nathaniel Jimenez tested positive on 01/14 and 01/21 and she would like to know how long to keep him isolated.  She is also concerned with if and when she should test him again.  Please advise.

## 2020-02-21 NOTE — Telephone Encounter (Signed)
So this is the third message  See previous note to add on end of day to figure out what we need to do  .

## 2020-02-21 NOTE — Telephone Encounter (Signed)
Called and spoke with guardian Coy Saunas. Explained message, still had many concerns with message that was received because per guardian, "Lionel is a not typical patient".    Please see My Chart message from 02/21/2020.

## 2020-02-21 NOTE — Telephone Encounter (Signed)
So I was not in the office  Friday and didn't know that follow up was delayed .  unfortunately you were told incorrect information.  Apologize that that this happened. I hope Nathaniel Jimenez is doing okay. Unless you wish otherwise please add him on at 430 today for either a telephone or video visit.  And we can discuss [name] condition.

## 2020-02-21 NOTE — Telephone Encounter (Signed)
Glad he is doing well.Ok

## 2020-02-21 NOTE — Telephone Encounter (Signed)
Spoke with guardian Coy Saunas. Offered virtual appointment for today at 4:30 guardian refused, saying that the patient went back to the program today,since he tested negative on Friday.   Coy Saunas said that she will schedule an appointment in the next few days to discuss if this happens again in future what are next steps.

## 2020-02-22 ENCOUNTER — Telehealth (INDEPENDENT_AMBULATORY_CARE_PROVIDER_SITE_OTHER): Payer: Medicaid Other | Admitting: Internal Medicine

## 2020-02-22 ENCOUNTER — Encounter: Payer: Self-pay | Admitting: Internal Medicine

## 2020-02-22 ENCOUNTER — Other Ambulatory Visit: Payer: Self-pay

## 2020-02-22 DIAGNOSIS — U071 COVID-19: Secondary | ICD-10-CM

## 2020-02-22 DIAGNOSIS — Q909 Down syndrome, unspecified: Secondary | ICD-10-CM | POA: Diagnosis not present

## 2020-02-22 DIAGNOSIS — Q249 Congenital malformation of heart, unspecified: Secondary | ICD-10-CM | POA: Diagnosis not present

## 2020-02-22 NOTE — Progress Notes (Signed)
Virtual Visit via Video Note  I connected with@ on 02/22/20 at  1:30 PM EST by a video enabled telemedicine application and verified that I am speaking with the correct person using two identifiers. Location patient: home Location provider:work  office Persons participating in the virtual visit: patient, mom patient is nonverbal provider With mother because of technical difficulties there camera was blocked and they could see me but I could not see her. Transition to a telephone call.  WIth national recommendations  regarding COVID 19 pandemic   video visit is advised over in office visit for this patient.  Patient aware  of the limitations of evaluation and management by telemedicine and  availability of in person appointments. and agreed to proceed.   HPI: Nathaniel Jimenez mom presents for video visit   Nathaniel Jimenez is in a program of disabled people autistic and other  and 1 tested positive so they had him tested positive on January 14 with no symptoms.  2 days later his vaccinated  parents had some mild respiratory symptoms and they tested positive. Although he was well, it was required to have a negative test before he goes back to program. Retest on the 24th was positive and on the 28th was negative so he was going back to his program. He never had any significant symptoms has had the initial vaccine series March and April Pfizer. Questions about testing what it means ability to spread disease and follow-up booster.   ROS: See pertinent positives and negatives per HPI.  Past Medical History:  Diagnosis Date  . Autism   . Down syndrome   . Heart murmur     Past Surgical History:  Procedure Laterality Date  . CARDIAC SURGERY     x2 - One at 3 months and one at 15 months  . CHOLECYSTECTOMY N/A 02/19/2018   Procedure: LAPAROSCOPIC CHOLECYSTECTOMY;  Surgeon: Abigail Miyamoto, MD;  Location: [facility] OR;  Service: General;  Laterality: N/A;  . DENTAL SURGERY      Family History  Problem  Relation Age of Onset  . Healthy Mother   . Healthy Father   . Cancer Maternal Grandmother        Merkle cell, skin cancer  . Osteoarthritis Maternal Grandmother   . Osteoporosis Maternal Grandmother   . Bladder Cancer Maternal Grandfather   . Healthy Paternal Grandmother   . Healthy Paternal Grandfather     Social History   Tobacco Use  . Smoking status: Never Smoker  . Smokeless tobacco: Never Used  Vaping Use  . Vaping Use: Never used  Substance Use Topics  . Alcohol use: No    Alcohol/week: 0.0 standard drinks  . Drug use: No     No current outpatient medications on file.  EXAM: BP Readings from Last 3 Encounters:  12/07/19 106/66  03/02/19 90/60  02/19/18 (!) 82/40    VITALS unable to do visual. Agility not working correctly Lab Results  Component Value Date   WBC 4.9 12/07/2019   HGB 16.3 12/07/2019   HCT 46.3 12/07/2019   PLT 194 12/07/2019   GLUCOSE 78 12/07/2019   CHOL 142 12/07/2019   TRIG 75 12/07/2019   HDL 37 (L) 12/07/2019   LDLCALC 89 12/07/2019   ALT 18 12/07/2019   AST 22 12/07/2019   NA 142 12/07/2019   K 4.0 12/07/2019   CL 103 12/07/2019   CREATININE 0.94 12/07/2019   BUN 16 12/07/2019   CO2 29 12/07/2019   TSH 1.05 12/07/2019  ASSESSMENT AND PLAN:  Discussed the following assessment and plan:    ICD-10-CM   1. Lab test positive for detection of COVID-19 virus  U07.1   2. Down syndrome  Q90.9   3. Adult congenital heart disease  Q24.9    Sounds like Nathaniel Jimenez had asymptomatic Covid positive. Discussed that normally we do not do a follow-up to make sure negative but he is in a program where he would be exposed to some immunosuppressed higher risk vulnerable population so that is probably why they required negative test before return.  Reviewed  interpretation of test symptomatic disease usually 10 days of isolation At his point I would suggest if needed a booster at no earlier than 2 to 3 months to have the most affected with a  few of side effects. Can be decided at that time. Counseled.   Expectant management and discussion of plan and treatment with opportunity to ask questions and all were answered. The patient agreed with the plan and demonstrated an understanding of the instructions.   Advised to call back or seek an in-person evaluation if worsening  or having  further concerns . Return for as needed or when due . I provided 16 minutes of non-face-to-face time during this encounter.   Berniece Andreas, MD

## 2020-03-28 ENCOUNTER — Telehealth: Payer: Self-pay | Admitting: Internal Medicine

## 2020-03-28 NOTE — Telephone Encounter (Signed)
Pt is holding urine--hasn't been to bathroom since 4 am this morning.  Doesn't seem like he is in pain.  Mother is concerned but not sure if she should be worried or not.  Pt is eating and drinking fine.  Please advise.

## 2020-03-28 NOTE — Telephone Encounter (Signed)
Please advise 

## 2020-03-29 NOTE — Telephone Encounter (Signed)
Just got this message today  Please check on how doing.

## 2020-03-29 NOTE — Telephone Encounter (Signed)
Spoke with Nathaniel Jimenez had just urinated a very large amount.  He had only urinated twice yesterday , and twice today.  Pt is not showing any signs of fever, pain or any discomfort.

## 2020-03-29 NOTE — Telephone Encounter (Signed)
So this is a second message I think there are 2 threads. Yeah I was continue to observe him and encourage regular bathroom use in case he is withholding for behavioral reasons. As long as his stream is okay and is able to empty his bladder I do not suspect anything serious.

## 2020-03-29 NOTE — Telephone Encounter (Signed)
encourage    Him to go more frequently as possible  and stay  Hydrated  Doesn't seem like an obstruction or infection but if worried about this can make a visit  Virtual or other

## 2020-03-30 NOTE — Telephone Encounter (Signed)
This is a second thread. My chart message had been sent concerning this same issue.    Sent Ms. Deshotel the message below via My Chart.

## 2020-03-31 ENCOUNTER — Telehealth: Payer: Self-pay | Admitting: Internal Medicine

## 2020-03-31 NOTE — Telephone Encounter (Signed)
Fleet Contras from [facility] is calling and wanted to see if provider received a certificate of medical necessity that was faxed on 3/4. CB is [phone] opt 2

## 2020-04-03 NOTE — Telephone Encounter (Signed)
Signed  form for supplies    Given to team to fax  Please inform them when completed

## 2020-04-03 NOTE — Telephone Encounter (Signed)
Form was faxed to [facility] [employer]. Fax [phone].

## 2020-04-03 NOTE — Telephone Encounter (Signed)
I signed a form for supplies and put it on the desk

## 2020-04-04 NOTE — Telephone Encounter (Signed)
Faxed on 3/14. Fax- [phone]

## 2020-10-27 ENCOUNTER — Ambulatory Visit (INDEPENDENT_AMBULATORY_CARE_PROVIDER_SITE_OTHER): Payer: Medicaid Other | Admitting: Family Medicine

## 2020-10-27 ENCOUNTER — Other Ambulatory Visit: Payer: Self-pay

## 2020-10-27 DIAGNOSIS — H60501 Unspecified acute noninfective otitis externa, right ear: Secondary | ICD-10-CM | POA: Diagnosis not present

## 2020-10-27 DIAGNOSIS — R300 Dysuria: Secondary | ICD-10-CM

## 2020-10-27 LAB — POCT URINALYSIS DIPSTICK
Bilirubin, UA: NEGATIVE
Blood, UA: NEGATIVE
Glucose, UA: NEGATIVE
Ketones, UA: NEGATIVE
Leukocytes, UA: NEGATIVE
Nitrite, UA: NEGATIVE
Protein, UA: NEGATIVE
Spec Grav, UA: <=1.005 — AB (ref 1.010–1.025)
Urobilinogen, UA: 0.2 E.U./dL
pH, UA: 6 (ref 5.0–8.0)

## 2020-10-27 MED ORDER — CIPROFLOXACIN-DEXAMETHASONE 0.3-0.1 % OT SUSP: 4.0000 [drp] | Freq: Two times a day (BID) | OTIC | 0 refills | Status: DC

## 2020-10-27 NOTE — Progress Notes (Signed)
Established Patient Office Visit  Subjective:  Patient ID: Nathaniel Jimenez, male    DOB: 1990-05-07  Age: 30 y.o. MRN: [id number]  CC:  Chief Complaint  Patient presents with   Ear Fullness    R ear, pulling on the ear, flaky crust around edges of ear    HPI [name] presents for right ear concerns.  Mom relates he has had some flaking from the ear and a bit of crusting around the edges.  He seems to have some discomfort at times.  Some pulling of the ear more than usual.  No fever.  No purulent drainage.  They state he is only had 2 or 3 ear infections in his lifetime.  He has Down syndrome and cannot obtain any history from patient.  They also relate separate concerned that sometimes when he is urinating he makes "noises ".  They are not sure if this is a sensation of urinating that he is upset about but they want to rule out UTI.  He has not had prior UTI history.  No fever or chills.  Past Medical History:  Diagnosis Date   Autism    Down syndrome    Heart murmur     Past Surgical History:  Procedure Laterality Date   CARDIAC SURGERY     x2 - One at 3 months and one at 48 months   CHOLECYSTECTOMY N/A 02/19/2018   Procedure: LAPAROSCOPIC CHOLECYSTECTOMY;  Surgeon: Abigail Miyamoto, MD;  Location: [facility] OR;  Service: General;  Laterality: N/A;   DENTAL SURGERY      Family History  Problem Relation Age of Onset   Healthy Mother    Healthy Father    Cancer Maternal Grandmother        Merkle cell, skin cancer   Osteoarthritis Maternal Grandmother    Osteoporosis Maternal Grandmother    Bladder Cancer Maternal Grandfather    Healthy Paternal Grandmother    Healthy Paternal Grandfather     Social History   Socioeconomic History   Marital status: Single    Spouse name: Not on file   Number of children: 0   Years of education: Not on file   Highest education level: Not on file  Occupational History   Occupation: disabled  Tobacco Use   Smoking status: Never    Smokeless tobacco: Never  Vaping Use   Vaping Use: Never used  Substance and Sexual Activity   Alcohol use: No    Alcohol/week: 0.0 standard drinks   Drug use: No   Sexual activity: Not on file  Other Topics Concern   Not on file  Social History Narrative   Lives with parents and is non-verbal, autistic and has down syndrome.    Born in [place] had heart surgery yet L lived in [place] outside of [place] before moving to [place] with family. For fathers job Acupuncturist.    Is in [employer] [facility].      Social Determinants of Health   Financial Resource Strain: Not on file  Food Insecurity: Not on file  Transportation Needs: Not on file  Physical Activity: Not on file  Stress: Not on file  Social Connections: Not on file  Intimate Partner Violence: Not on file    No outpatient medications prior to visit.   No facility-administered medications prior to visit.    No Known Allergies  ROS Review of Systems  Constitutional:  Negative for chills and fever.  HENT:  Positive for ear pain.  Objective:    Physical Exam Vitals reviewed.  Constitutional:      Comments: Appears somewhat anxious.  Uncooperative with exam but father did assist  HENT:     Ears:     Comments: Left canal and eardrum are normal.  Right canal reveals minimal cerumen.  He does have some erythema in the ear canal deep within the canal.  Portion of eardrum visualized appeared normal.   There were no vitals taken for this visit. Wt Readings from Last 3 Encounters:  12/07/19 102 lb (46.3 kg)  03/02/19 101 lb (45.8 kg)  02/19/18 100 lb (45.4 kg)     Health Maintenance Due  Topic Date Due   HIV Screening  Never done   Hepatitis C Screening  Never done   INFLUENZA VACCINE  Never done    There are no preventive care reminders to display for this patient.  Lab Results  Component Value Date   TSH 1.05 12/07/2019   Lab Results  Component Value Date   WBC 4.9  12/07/2019   HGB 16.3 12/07/2019   HCT 46.3 12/07/2019   MCV 97.1 12/07/2019   PLT 194 12/07/2019   Lab Results  Component Value Date   NA 142 12/07/2019   K 4.0 12/07/2019   CO2 29 12/07/2019   GLUCOSE 78 12/07/2019   BUN 16 12/07/2019   CREATININE 0.94 12/07/2019   BILITOT 1.8 (H) 12/07/2019   ALKPHOS 65 12/26/2017   AST 22 12/07/2019   ALT 18 12/07/2019   PROT 6.7 12/07/2019   ALBUMIN 4.7 12/26/2017   CALCIUM 9.2 12/07/2019   ANIONGAP 8 02/16/2018   GFR 99.05 12/26/2017   Lab Results  Component Value Date   CHOL 142 12/07/2019   Lab Results  Component Value Date   HDL 37 (L) 12/07/2019   Lab Results  Component Value Date   LDLCALC 89 12/07/2019   Lab Results  Component Value Date   TRIG 75 12/07/2019   Lab Results  Component Value Date   CHOLHDL 3.8 12/07/2019   No results found for: HGBA1C    Assessment & Plan:   #1 right otitis externa. -Keep ear dry as possible -Ciprodex eardrops 4 drops right canal twice daily for 1 week  #2 concerns for possible dysuria -Check urine dipstick and consider culture if indicated -Urine dipstick is normal.  Reassurance given.  Meds ordered this encounter  Medications   ciprofloxacin-dexamethasone (CIPRODEX) OTIC suspension    Sig: Place 4 drops into both ears 2 (two) times daily.    Dispense:  7.5 mL    Refill:  0    Follow-up: No follow-ups on file.    Evelena Peat, MD

## 2020-11-27 NOTE — Progress Notes (Signed)
Chief Complaint  Patient presents with   Annual Exam     HPI: Patient  Nathaniel Jimenez  30 y.o. comes in today for Preventive Health Care visit here with parents today and form to be signed for after [facility]. 's health is been stable and they feel he is doing pretty well. Ever since his gallbladder surgery his GI tract is done better they avoid certain foods that cause a problem. No obvious cardiac respiratory symptoms has a follow-up with cardiology in the near future.  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 Up-to-date on immunizations.  Declining a flu shot this year. Has pronation and feet is doing better with self bought hightop boots calluses are resolving seems to be comfortable.  No unusual coughing have ear symptoms treated for EO a little while back.  Has lots of wax. Health Maintenance  Topic Date Due   Pneumococcal Vaccine 67-75 Years old (1 - PCV) Never done   HIV Screening  Never done   Hepatitis C Screening  Never done   COVID-19 Vaccine (3 - Pfizer risk series) 06/02/2019   INFLUENZA VACCINE  Never done   TETANUS/TDAP  11/09/2026   HPV VACCINES  Aged Out     ROS:  REST of 12 system review negative except as per HPI   Past Medical History:  Diagnosis Date   Autism    Down syndrome    Heart murmur     Past Surgical History:  Procedure Laterality Date   CARDIAC SURGERY     x2 - One at 3 months and one at 65 months   CHOLECYSTECTOMY N/A 02/19/2018   Procedure: LAPAROSCOPIC CHOLECYSTECTOMY;  Surgeon: Abigail Miyamoto, MD;  Location: [facility] OR;  Service: General;  Laterality: N/A;   DENTAL SURGERY      Family History  Problem Relation Age of Onset   Healthy Mother    Healthy Father    Cancer Maternal Grandmother        Merkle cell, skin cancer   Osteoarthritis Maternal Grandmother    Osteoporosis Maternal Grandmother    Bladder Cancer Maternal Grandfather    Healthy  Paternal Grandmother    Healthy Paternal Grandfather     Social History   Socioeconomic History   Marital status: Single    Spouse name: Not on file   Number of children: 0   Years of education: Not on file   Highest education level: Not on file  Occupational History   Occupation: disabled  Tobacco Use   Smoking status: Never   Smokeless tobacco: Never  Vaping Use   Vaping Use: Never used  Substance and Sexual Activity   Alcohol use: No    Alcohol/week: 0.0 standard drinks   Drug use: No   Sexual activity: Not on file  Other Topics Concern   Not on file  Social History Narrative   Lives with parents and is non-verbal, autistic and has down syndrome.    Born in [place] had heart surgery yet L lived in [place] outside of [place] before moving to [place] with family. For fathers job Acupuncturist.    Is in [facility].      Social Determinants of Health   Financial Resource Strain: Not on file  Food Insecurity: Not on file  Transportation Needs: Not on file  Physical Activity: Not on file  Stress: Not on file  Social Connections: Not on file  Outpatient Medications Prior to Visit  Medication Sig Dispense Refill   ciprofloxacin-dexamethasone (CIPRODEX) OTIC suspension Place 4 drops into both ears 2 (two) times daily. 7.5 mL 0   No facility-administered medications prior to visit.     EXAM:  BP 110/60 (BP Location: Left Arm, Patient Position: Sitting, Cuff Size: Normal)   Temp (!) 97.3 F (36.3 C) (Temporal)   Ht 4\' 10"  (1.473 m)   Wt 105 lb (47.6 kg)   BMI 21.95 kg/m   Body mass index is 21.95 kg/m. Wt Readings from Last 3 Encounters:  11/28/20 105 lb (47.6 kg)  12/07/19 102 lb (46.3 kg)  03/02/19 101 lb (45.8 kg)    Physical Exam: Vital signs reviewed 04/30/19 is a alert nonverbal Down's but his alert parents can hold for exam some sensitivity in no acute distress  HEENT: atraumatic , Eyes: PERRL conjunctiva clear,  Nares: No obvious congestion tenderness., Ears: EAC's clear rightTMs with normal landmarks.  Left EAC +1 wax hard to see TM uncooperative but no redness swelling or tenderness obvious mouth: Macroglossia no noisy breathing airway appears to be open NECK: supple without masses, thyromegaly or bruits. CHEST/PULM:  Clear to auscultation and percussion breath sounds equal no wheeze , rales or rhonchi.  CV: PMI is nondisplaced, S1 S2 no gallops, 2/6 to 3/6 systolic long murmur  rubs. no cce  ABDOMEN: Bowel sounds normal nontender  No guard or rebound, no ov sitting hepato splenomegal  Extremtities:  No clubbing cyanosis or edema, no acute joint swelling or redness no focal atrophy downs digits  feet  pronation  medial callous no ulcer or lesions    Independent out towing gait   SKIN: No acute rashes  dryness normal turgor, color, no bruising or petechiae. LN: no cervical adenopathy  Lab Results  Component Value Date   WBC 4.2 11/28/2020   HGB 15.5 11/28/2020   HCT 44.8 11/28/2020   PLT 188.0 11/28/2020   GLUCOSE 90 11/28/2020   CHOL 146 11/28/2020   TRIG 135.0 11/28/2020   HDL 37.00 (L) 11/28/2020   LDLCALC 82 11/28/2020   ALT 18 11/28/2020   AST 22 11/28/2020   NA 140 11/28/2020   K 3.8 11/28/2020   CL 101 11/28/2020   CREATININE 0.99 11/28/2020   BUN 14 11/28/2020   CO2 32 11/28/2020   TSH 1.27 11/28/2020    BP Readings from Last 3 Encounters:  11/28/20 110/60  12/07/19 106/66  03/02/19 90/60    ASSESSMENT AND PLAN:  Discussed the following assessment and plan:    ICD-10-CM   1. Encounter for general adult medical examination with abnormal findings  Z00.01 Basic metabolic panel    CBC with Differential/Platelet    Hepatic function panel    Lipid panel    TSH    T4, free    T4, free    TSH    Lipid panel    Hepatic function panel    CBC with Differential/Platelet    Basic metabolic panel    2. Adult congenital heart disease  Q24.9 Basic metabolic panel    CBC with  Differential/Platelet    Hepatic function panel    Lipid panel    TSH    T4, free    QuantiFERON-TB Gold Plus    QuantiFERON-TB Gold Plus    T4, free    TSH    Lipid panel    Hepatic function panel    CBC with Differential/Platelet    Basic metabolic panel  3. S/P atrioventricular septal defect repair  Z87.74 Basic metabolic panel    CBC with Differential/Platelet    Hepatic function panel    Lipid panel    TSH    T4, free    QuantiFERON-TB Gold Plus    QuantiFERON-TB Gold Plus    T4, free    TSH    Lipid panel    Hepatic function panel    CBC with Differential/Platelet    Basic metabolic panel    4. Down syndrome  Q90.9 Basic metabolic panel    CBC with Differential/Platelet    Hepatic function panel    Lipid panel    TSH    T4, free    QuantiFERON-TB Gold Plus    QuantiFERON-TB Gold Plus    T4, free    TSH    Lipid panel    Hepatic function panel    CBC with Differential/Platelet    Basic metabolic panel    5. Autism  F84.0 Basic metabolic panel    CBC with Differential/Platelet    Hepatic function panel    Lipid panel    TSH    T4, free    QuantiFERON-TB Gold Plus    QuantiFERON-TB Gold Plus    T4, free    TSH    Lipid panel    Hepatic function panel    CBC with Differential/Platelet    Basic metabolic panel    6. Screening-pulmonary TB  Z11.1 QuantiFERON-TB Gold Plus    QuantiFERON-TB Gold Plus    7. Personal history of COVID-19  Z24.16     Form reviewed signed and  completed . No tb evidence  will check quanitferon gold in case needed for  program . Has f/u cards  apparent no new sx  Has restricted diet and doing much better  not dx with celiac and cholecystectomy helped sx a lot  Check lab cbc thyroid  monitoring also  Return in about 1 year (around 11/28/2021).  Patient Care Team: Madelin Headings, MD as PCP - General (Internal Medicine) Hilton Cork, MD as Referring Physician (Pediatric Cardiology) Patient Instructions   Good to see you today and glad doing well.   Will notify you  of labs when available.   Let us know if we can help further .   Can try  otc soft inserts  arch for the foot pronation.        Neta Mends. Stacie Templin M.D.

## 2020-11-28 ENCOUNTER — Encounter: Payer: Self-pay | Admitting: Internal Medicine

## 2020-11-28 ENCOUNTER — Ambulatory Visit (INDEPENDENT_AMBULATORY_CARE_PROVIDER_SITE_OTHER): Payer: Medicaid Other | Admitting: Internal Medicine

## 2020-11-28 ENCOUNTER — Other Ambulatory Visit: Payer: Self-pay

## 2020-11-28 VITALS — BP 110/60 | Temp 97.3°F | Ht <= 58 in | Wt 105.0 lb

## 2020-11-28 DIAGNOSIS — F840 Autistic disorder: Secondary | ICD-10-CM | POA: Diagnosis not present

## 2020-11-28 DIAGNOSIS — Z8616 Personal history of COVID-19: Secondary | ICD-10-CM | POA: Diagnosis not present

## 2020-11-28 DIAGNOSIS — Z0001 Encounter for general adult medical examination with abnormal findings: Secondary | ICD-10-CM

## 2020-11-28 DIAGNOSIS — Q249 Congenital malformation of heart, unspecified: Secondary | ICD-10-CM | POA: Diagnosis not present

## 2020-11-28 DIAGNOSIS — Z8774 Personal history of (corrected) congenital malformations of heart and circulatory system: Secondary | ICD-10-CM | POA: Diagnosis not present

## 2020-11-28 DIAGNOSIS — Z111 Encounter for screening for respiratory tuberculosis: Secondary | ICD-10-CM | POA: Diagnosis not present

## 2020-11-28 DIAGNOSIS — F848 Other pervasive developmental disorders: Secondary | ICD-10-CM

## 2020-11-28 DIAGNOSIS — Q909 Down syndrome, unspecified: Secondary | ICD-10-CM | POA: Diagnosis not present

## 2020-11-28 LAB — LIPID PANEL
Cholesterol: 146 mg/dL (ref 0–200)
HDL: 37 mg/dL — ABNORMAL LOW (ref >39.00)
LDL Cholesterol: 82 mg/dL (ref 0–99)
NonHDL: 108.88
Total CHOL/HDL Ratio: 4
Triglycerides: 135 mg/dL (ref 0.0–149.0)
VLDL: 27 mg/dL (ref 0.0–40.0)

## 2020-11-28 LAB — BASIC METABOLIC PANEL
BUN: 14 mg/dL (ref 6–23)
CO2: 32 mEq/L (ref 19–32)
Calcium: 9 mg/dL (ref 8.4–10.5)
Chloride: 101 mEq/L (ref 96–112)
Creatinine, Ser: 0.99 mg/dL (ref 0.40–1.50)
GFR: 104.1 mL/min (ref >60.00)
Glucose, Bld: 90 mg/dL (ref 70–99)
Potassium: 3.8 mEq/L (ref 3.5–5.1)
Sodium: 140 mEq/L (ref 135–145)

## 2020-11-28 LAB — CBC WITH DIFFERENTIAL/PLATELET
Basophils Absolute: 0.1 10*3/uL (ref 0.0–0.1)
Basophils Relative: 2.2 % (ref 0.0–3.0)
Eosinophils Absolute: 0 10*3/uL (ref 0.0–0.7)
Eosinophils Relative: 0.8 % (ref 0.0–5.0)
HCT: 44.8 % (ref 39.0–52.0)
Hemoglobin: 15.5 g/dL (ref 13.0–17.0)
Lymphocytes Relative: 24.6 % (ref 12.0–46.0)
Lymphs Abs: 1 10*3/uL (ref 0.7–4.0)
MCHC: 34.5 g/dL (ref 30.0–36.0)
MCV: 99.9 fl (ref 78.0–100.0)
Monocytes Absolute: 0.3 10*3/uL (ref 0.1–1.0)
Monocytes Relative: 7.5 % (ref 3.0–12.0)
Neutro Abs: 2.7 10*3/uL (ref 1.4–7.7)
Neutrophils Relative %: 64.9 % (ref 43.0–77.0)
Platelets: 188 10*3/uL (ref 150.0–400.0)
RBC: 4.49 Mil/uL (ref 4.22–5.81)
RDW: 13.2 % (ref 11.5–15.5)
WBC: 4.2 10*3/uL (ref 4.0–10.5)

## 2020-11-28 LAB — HEPATIC FUNCTION PANEL
ALT: 18 U/L (ref 0–53)
AST: 22 U/L (ref 0–37)
Albumin: 4.2 g/dL (ref 3.5–5.2)
Alkaline Phosphatase: 69 U/L (ref 39–117)
Bilirubin, Direct: 0.2 mg/dL (ref 0.0–0.3)
Total Bilirubin: 1.6 mg/dL — ABNORMAL HIGH (ref 0.2–1.2)
Total Protein: 6.9 g/dL (ref 6.0–8.3)

## 2020-11-28 LAB — T4, FREE: Free T4: 0.85 ng/dL (ref 0.60–1.60)

## 2020-11-28 LAB — TSH: TSH: 1.27 u[IU]/mL (ref 0.35–5.50)

## 2020-11-28 NOTE — Patient Instructions (Addendum)
Good to see you today and glad doing well.   Will notify you  of labs when available.   Let us know if we can help further .   Can try  otc soft inserts  arch for the foot pronation.

## 2020-11-29 NOTE — Progress Notes (Signed)
Blood results are stable HDL good Nathaniel Jimenez cholesterol is slightly low continue activity and attention to healthy eating.  As possible.  Blood count and thyroid test are normal. No further action needed yearly check.  Waiting on the QuantiFERON gold result not back yet

## 2020-11-30 LAB — QUANTIFERON-TB GOLD PLUS
Mitogen-NIL: >10 IU/mL
NIL: 0.03 IU/mL
QuantiFERON-TB Gold Plus: NEGATIVE
TB1-NIL: 0 IU/mL
TB2-NIL: 0.02 IU/mL

## 2020-11-30 NOTE — Progress Notes (Signed)
Quantiferon gold negative   ie negative TB screening

## 2021-04-03 ENCOUNTER — Encounter: Payer: Self-pay | Admitting: Internal Medicine

## 2021-04-04 ENCOUNTER — Ambulatory Visit (INDEPENDENT_AMBULATORY_CARE_PROVIDER_SITE_OTHER): Payer: Medicaid Other | Admitting: Internal Medicine

## 2021-04-04 VITALS — BP 100/66 | Temp 97.0°F | Ht <= 58 in | Wt 105.0 lb

## 2021-04-04 DIAGNOSIS — S61219A Laceration without foreign body of unspecified finger without damage to nail, initial encounter: Secondary | ICD-10-CM

## 2021-04-04 DIAGNOSIS — Q909 Down syndrome, unspecified: Secondary | ICD-10-CM

## 2021-04-04 NOTE — Patient Instructions (Signed)
Continue moisturizing topical antibiotic    ?Avoid trauma s best possible. ? ?Take pix and can send in  if any concerns about  infections increasing.  ?

## 2021-04-04 NOTE — Progress Notes (Signed)
? ?Chief Complaint  ?Patient presents with  ? Finger Injury  ? ? ?HPI: ?Nathaniel Jimenez 31 y.o. come in for sda with mom for   crack appearing  in tuft of r middle finger  .noted  this week .  No known trauma although  had  irritated hands from frequent hand washing and winter dry skin . No  week[ping  using  neosporin  and topical moisturizer  resists  some   but other ok  cannot use  bandage or other manipulation because he  resists .   No biting  noted  and no redness weeping or infection noted .   ?Would need a note  to use  any topicals   for day care .  ?Parents now separated and in different households.   ?ROS: See pertinent positives and negatives per HPI. ? ?Past Medical History:  ?Diagnosis Date  ? Autism   ? Down syndrome   ? Heart murmur   ? ? ?Family History  ?Problem Relation Age of Onset  ? Healthy Mother   ? Healthy Father   ? Cancer Maternal Grandmother   ?     Merkle cell, skin cancer  ? Osteoarthritis Maternal Grandmother   ? Osteoporosis Maternal Grandmother   ? Bladder Cancer Maternal Grandfather   ? Healthy Paternal Grandmother   ? Healthy Paternal Grandfather   ? ? ?Social History  ? ?Socioeconomic History  ? Marital status: Single  ?  Spouse name: Not on file  ? Number of children: 0  ? Years of education: Not on file  ? Highest education level: Not on file  ?Occupational History  ? Occupation: disabled  ?Tobacco Use  ? Smoking status: Never  ? Smokeless tobacco: Never  ?Vaping Use  ? Vaping Use: Never used  ?Substance and Sexual Activity  ? Alcohol use: No  ?  Alcohol/week: 0.0 standard drinks  ? Drug use: No  ? Sexual activity: Not on file  ?Other Topics Concern  ? Not on file  ?Social History Narrative  ? Lives with parents and is non-verbal, autistic and has down syndrome.   ? Born in [place] had heart surgery yet L lived in [place] outside of [place] before moving to [place] with family. For fathers job Acupuncturist.   ? Is in [facility].  ?   ? ?Social  Determinants of Health  ? ?Financial Resource Strain: Not on file  ?Food Insecurity: Not on file  ?Transportation Needs: Not on file  ?Physical Activity: Not on file  ?Stress: Not on file  ?Social Connections: Not on file  ? ? ?No outpatient medications prior to visit.  ? ?No facility-administered medications prior to visit.  ? ? ? ?EXAM: ? ?BP 100/66 (BP Location: Right Arm, Patient Position: Sitting, Cuff Size: Normal)   Temp (!) 97 ?F (36.1 ?C) (Temporal)   Ht 4\' 10"  (1.473 m)   Wt 105 lb (47.6 kg)   BMI 21.95 kg/m?  ? ?Body mass index is 21.95 kg/m?. ? ?GENERAL: vitals reviewed and listed above, alert,non verbal non toxic  appears well hydrated and in no acute distress avoids exam of   hands  resists  but uses  symmetrically  ?HEENT: atraumatic, conjunctiva  clear, no obvious abnormalities on inspection of external nose and ears  ? ?MS: moves all extremities  hands  pink somewhat dry skin  but no  acute rash   tuft of middle finger has crack fairly deep but no weeping oozing  or swelling .  ? ?Lab Results  ?Component Value Date  ? WBC 4.2 11/28/2020  ? HGB 15.5 11/28/2020  ? HCT 44.8 11/28/2020  ? PLT 188.0 11/28/2020  ? GLUCOSE 90 11/28/2020  ? CHOL 146 11/28/2020  ? TRIG 135.0 11/28/2020  ? HDL 37.00 (L) 11/28/2020  ? LDLCALC 82 11/28/2020  ? ALT 18 11/28/2020  ? AST 22 11/28/2020  ? NA 140 11/28/2020  ? K 3.8 11/28/2020  ? CL 101 11/28/2020  ? CREATININE 0.99 11/28/2020  ? BUN 14 11/28/2020  ? CO2 32 11/28/2020  ? TSH 1.27 11/28/2020  ? ?BP Readings from Last 3 Encounters:  ?04/04/21 100/66  ?11/28/20 110/60  ?12/07/19 106/66  ? ? ?ASSESSMENT AND PLAN: ? ?Discussed the following assessment and plan: ? ?Cut of finger superficial  - not infected  ? ?Down syndrome ?Disc local care  moisturizing  skin and avoid  etoh based lotions creams    ok to use neosporin ointment  alone   and follow for signs of  infection.  ?Note for care care . ?  Expectant management. And fu as indicated  ?-Patient advised to return  or notify health care team  if  new concerns arise. ? ?Patient Instructions  ?Continue moisturizing topical antibiotic    ?Avoid trauma s best possible. ? ?Take pix and can send in  if any concerns about  infections increasing.  ? ? ?Neta Mends. Sani Madariaga M.D. ?

## 2021-04-08 ENCOUNTER — Encounter: Payer: Self-pay | Admitting: Internal Medicine

## 2021-04-09 ENCOUNTER — Encounter: Payer: Self-pay | Admitting: Internal Medicine

## 2021-06-07 ENCOUNTER — Telehealth: Payer: Self-pay

## 2021-06-07 NOTE — Telephone Encounter (Signed)
Form from [facility] request for prior approval CMN/PA

## 2021-06-08 ENCOUNTER — Encounter: Payer: Self-pay | Admitting: Internal Medicine

## 2021-06-11 NOTE — Telephone Encounter (Signed)
Form singed back to you

## 2021-06-11 NOTE — Telephone Encounter (Signed)
Forms faxed

## 2021-06-11 NOTE — Telephone Encounter (Signed)
Form signed  and back to  you

## 2021-06-25 ENCOUNTER — Telehealth: Payer: Self-pay | Admitting: Internal Medicine

## 2021-06-25 NOTE — Telephone Encounter (Signed)
Pt's mother came in to request that Dr. Regis Bill complete and sign a Person-Centered Profile for [employer]. I asked Pt's mother to please complete and sign the form request sheet and I attached it to all paperwork in her packet and placed it back in the [facility] folder.  Said folder was put in Dr. Velora Mediate folder for completion & signature. (IC)

## 2021-07-03 NOTE — Telephone Encounter (Signed)
Pt's mother called to check on the status of the form she dropped off for Dr's signature. She is aware that the Dr will be back in the office on 07/04/21.

## 2021-07-06 ENCOUNTER — Encounter: Payer: Self-pay | Admitting: Internal Medicine

## 2021-07-09 NOTE — Telephone Encounter (Signed)
Father was called informed paperwork is ready for pick up

## 2021-07-09 NOTE — Telephone Encounter (Signed)
Father informed paperwork is ready for pick up

## 2021-10-26 ENCOUNTER — Telehealth: Payer: Self-pay | Admitting: Internal Medicine

## 2021-10-26 NOTE — Telephone Encounter (Addendum)
Pt's father called to schedule Pt for a CPE. Last CPE 11/28/20. MD did not have any available appts for the week of 12/03/21, except for afternoon appointments.  Pt is special needs and cannot go so many hours without eating. Father wanted an appt earlier in the month. Father suggested 11/21/21 for CPE at 2pm. Father was informed CPE usually a year and a day from last.  Father stated he would contact insurance. Father is asking if he can bring Pt in the early morning to do labs, then return at 2 pm for CPE?  Please advise.

## 2021-11-04 ENCOUNTER — Other Ambulatory Visit: Payer: Self-pay | Admitting: Internal Medicine

## 2021-11-04 DIAGNOSIS — R718 Other abnormality of red blood cells: Secondary | ICD-10-CM

## 2021-11-04 DIAGNOSIS — Q249 Congenital malformation of heart, unspecified: Secondary | ICD-10-CM

## 2021-11-04 DIAGNOSIS — Q909 Down syndrome, unspecified: Secondary | ICD-10-CM

## 2021-11-04 DIAGNOSIS — Z79899 Other long term (current) drug therapy: Secondary | ICD-10-CM

## 2021-11-04 DIAGNOSIS — Z8774 Personal history of (corrected) congenital malformations of heart and circulatory system: Secondary | ICD-10-CM

## 2021-11-04 DIAGNOSIS — E786 Lipoprotein deficiency: Secondary | ICD-10-CM

## 2021-11-04 DIAGNOSIS — K5909 Other constipation: Secondary | ICD-10-CM

## 2021-11-04 DIAGNOSIS — F840 Autistic disorder: Secondary | ICD-10-CM

## 2021-11-04 NOTE — Progress Notes (Unsigned)
I have ordered   future labs can get fasting lab appt 3-5 days before cpe  and we should have results   by visit time ...doesnt have to wait until the day of the visit .

## 2021-11-06 NOTE — Progress Notes (Signed)
Spoke to patient's father. Inform of a message below. Lab appt scheduled.

## 2021-11-06 NOTE — Telephone Encounter (Signed)
Spoke to patient's father. Lab appt scheduled.

## 2021-11-19 ENCOUNTER — Other Ambulatory Visit: Payer: Self-pay

## 2021-11-20 ENCOUNTER — Other Ambulatory Visit (INDEPENDENT_AMBULATORY_CARE_PROVIDER_SITE_OTHER): Payer: Commercial Managed Care - HMO

## 2021-11-20 DIAGNOSIS — Q249 Congenital malformation of heart, unspecified: Secondary | ICD-10-CM | POA: Diagnosis not present

## 2021-11-20 DIAGNOSIS — Z79899 Other long term (current) drug therapy: Secondary | ICD-10-CM

## 2021-11-20 DIAGNOSIS — Z8774 Personal history of (corrected) congenital malformations of heart and circulatory system: Secondary | ICD-10-CM

## 2021-11-20 DIAGNOSIS — R718 Other abnormality of red blood cells: Secondary | ICD-10-CM

## 2021-11-20 DIAGNOSIS — Q909 Down syndrome, unspecified: Secondary | ICD-10-CM | POA: Diagnosis not present

## 2021-11-20 DIAGNOSIS — K5909 Other constipation: Secondary | ICD-10-CM

## 2021-11-20 DIAGNOSIS — F840 Autistic disorder: Secondary | ICD-10-CM | POA: Diagnosis not present

## 2021-11-20 DIAGNOSIS — E786 Lipoprotein deficiency: Secondary | ICD-10-CM

## 2021-11-20 LAB — CBC WITH DIFFERENTIAL/PLATELET
Basophils Absolute: 0 10*3/uL (ref 0.0–0.1)
Basophils Relative: 0.9 % (ref 0.0–3.0)
Eosinophils Absolute: 0.1 10*3/uL (ref 0.0–0.7)
Eosinophils Relative: 1.3 % (ref 0.0–5.0)
HCT: 47 % (ref 39.0–52.0)
Hemoglobin: 16.2 g/dL (ref 13.0–17.0)
Lymphocytes Relative: 32.8 % (ref 12.0–46.0)
Lymphs Abs: 1.4 10*3/uL (ref 0.7–4.0)
MCHC: 34.5 g/dL (ref 30.0–36.0)
MCV: 99.5 fl (ref 78.0–100.0)
Monocytes Absolute: 0.4 10*3/uL (ref 0.1–1.0)
Monocytes Relative: 9.1 % (ref 3.0–12.0)
Neutro Abs: 2.4 10*3/uL (ref 1.4–7.7)
Neutrophils Relative %: 55.9 % (ref 43.0–77.0)
Platelets: 204 10*3/uL (ref 150.0–400.0)
RBC: 4.73 Mil/uL (ref 4.22–5.81)
RDW: 12.7 % (ref 11.5–15.5)
WBC: 4.2 10*3/uL (ref 4.0–10.5)

## 2021-11-20 LAB — HEPATIC FUNCTION PANEL
ALT: 19 U/L (ref 0–53)
AST: 23 U/L (ref 0–37)
Albumin: 4.2 g/dL (ref 3.5–5.2)
Alkaline Phosphatase: 74 U/L (ref 39–117)
Bilirubin, Direct: 0.3 mg/dL (ref 0.0–0.3)
Total Bilirubin: 2.2 mg/dL — ABNORMAL HIGH (ref 0.2–1.2)
Total Protein: 7.2 g/dL (ref 6.0–8.3)

## 2021-11-20 LAB — BASIC METABOLIC PANEL
BUN: 21 mg/dL (ref 6–23)
CO2: 29 mEq/L (ref 19–32)
Calcium: 9.2 mg/dL (ref 8.4–10.5)
Chloride: 104 mEq/L (ref 96–112)
Creatinine, Ser: 1.1 mg/dL (ref 0.40–1.50)
GFR: 91.11 mL/min (ref >60.00)
Glucose, Bld: 85 mg/dL (ref 70–99)
Potassium: 3.9 mEq/L (ref 3.5–5.1)
Sodium: 141 mEq/L (ref 135–145)

## 2021-11-20 LAB — VITAMIN B12: Vitamin B-12: 594 pg/mL (ref 211–911)

## 2021-11-20 LAB — LIPID PANEL
Cholesterol: 146 mg/dL (ref 0–200)
HDL: 34.7 mg/dL — ABNORMAL LOW (ref >39.00)
LDL Cholesterol: 93 mg/dL (ref 0–99)
NonHDL: 110.88
Total CHOL/HDL Ratio: 4
Triglycerides: 87 mg/dL (ref 0.0–149.0)
VLDL: 17.4 mg/dL (ref 0.0–40.0)

## 2021-11-20 LAB — T4, FREE: Free T4: 0.71 ng/dL (ref 0.60–1.60)

## 2021-11-20 LAB — TSH: TSH: 1.22 u[IU]/mL (ref 0.35–5.50)

## 2021-11-20 LAB — HEMOGLOBIN A1C: Hgb A1c MFr Bld: 4.4 % — ABNORMAL LOW (ref 4.6–6.5)

## 2021-11-21 ENCOUNTER — Ambulatory Visit (INDEPENDENT_AMBULATORY_CARE_PROVIDER_SITE_OTHER): Payer: Medicaid Other | Admitting: Internal Medicine

## 2021-11-21 ENCOUNTER — Encounter: Payer: Self-pay | Admitting: Internal Medicine

## 2021-11-21 VITALS — BP 110/70 | HR 66 | Temp 96.7°F | Ht <= 58 in | Wt 105.8 lb

## 2021-11-21 DIAGNOSIS — Z8774 Personal history of (corrected) congenital malformations of heart and circulatory system: Secondary | ICD-10-CM | POA: Diagnosis not present

## 2021-11-21 DIAGNOSIS — Z0000 Encounter for general adult medical examination without abnormal findings: Secondary | ICD-10-CM | POA: Diagnosis not present

## 2021-11-21 DIAGNOSIS — F840 Autistic disorder: Secondary | ICD-10-CM

## 2021-11-21 DIAGNOSIS — Q249 Congenital malformation of heart, unspecified: Secondary | ICD-10-CM

## 2021-11-21 DIAGNOSIS — Q909 Down syndrome, unspecified: Secondary | ICD-10-CM | POA: Diagnosis not present

## 2021-11-21 NOTE — Progress Notes (Signed)
Chief Complaint  Patient presents with   Annual Exam    Patient is with mom. Brought daycare form for Dr. Regis Bill to sign off.     HPI: Patient  Nathaniel Jimenez  31 y.o. comes in today for Preventive Health Care visit  here with mom  Has form to complete for after [facility].  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  Has a list of avoidance foods Mom thinks he has been doing pretty well no new symptoms GI is controlled To see cardiology in February no symptoms of concern.  No TB exposure respiratory symptoms.  ADLs does wear a pull-up diapers but can signal to get help with toileting  Health Maintenance  Topic Date Due   HIV Screening  Never done   Hepatitis C Screening  Never done   COVID-19 Vaccine (3 - Pfizer risk series) 12/07/2021 (Originally 06/02/2019)   INFLUENZA VACCINE  04/21/2022 (Originally 08/21/2021)   TETANUS/TDAP  11/09/2026   HPV VACCINES  Aged Out   Health Maintenance Review LIFESTYLE:  Exercise:  ative [place]  in summer . Tobacco/ETS:n  Alcohol:  n Sugar beverages: Sleep: hit or miss  better    Drug use: no HH of   2 parents have separated to hh now  no pets     Dentist doing ok . Has teeth grinding  Likes  music and ipad screen    out side events.  Swim ROS:  REST of 12 system review negative except as per HPI no reported pulmonary shortness of breath cardiovascular symptoms   Past Medical History:  Diagnosis Date   Autism    Down syndrome    Heart murmur     Past Surgical History:  Procedure Laterality Date   CARDIAC SURGERY     x2 - One at 3 months and one at 63 months   CHOLECYSTECTOMY N/A 02/19/2018   Procedure: LAPAROSCOPIC CHOLECYSTECTOMY;  Surgeon: Coralie Keens, MD;  Location: [facility];  Service: General;  Laterality: N/A;   DENTAL SURGERY      Family History  Problem Relation Age of Onset   Healthy Mother    Healthy Father    Cancer Maternal Grandmother        Merkle cell, skin cancer    Osteoarthritis Maternal Grandmother    Osteoporosis Maternal Grandmother    Bladder Cancer Maternal Grandfather    Healthy Paternal Grandmother    Healthy Paternal Grandfather     Social History   Socioeconomic History   Marital status: Single    Spouse name: Not on file   Number of children: 0   Years of education: Not on file   Highest education level: Not on file  Occupational History   Occupation: disabled  Tobacco Use   Smoking status: Never   Smokeless tobacco: Never  Vaping Use   Vaping Use: Never used  Substance and Sexual Activity   Alcohol use: No    Alcohol/week: 0.0 standard drinks of alcohol   Drug use: No   Sexual activity: Not on file  Other Topics Concern   Not on file  Social History Narrative   Lives with parents and is non-verbal, autistic and has down syndrome.    Born in [place] had heart surgery yet L lived in [place] outside of [place] before moving to [place] with family. For fathers job Art gallery manager.    Is in [facility] after[facility].      Social Determinants  of Health   Financial Resource Strain: Not on file  Food Insecurity: Not on file  Transportation Needs: Not on file  Physical Activity: Not on file  Stress: Not on file  Social Connections: Not on file    No outpatient medications prior to visit.   No facility-administered medications prior to visit.     EXAM:  BP 110/70 (BP Location: Right Arm, Patient Position: Sitting, Cuff Size: Normal)   Pulse 66   Temp (!) 96.7 F (35.9 C) Comment: forehead  Ht 4\' 10"  (1.473 m)   Wt 105 lb 12.8 oz (48 kg)   SpO2 97%   BMI 22.11 kg/m   Body mass index is 22.11 kg/m. Wt Readings from Last 3 Encounters:  11/21/21 105 lb 12.8 oz (48 kg)  04/04/21 105 lb (47.6 kg)  11/28/20 105 lb (47.6 kg)    Physical Exam: Vital signs reviewed [id number] is a well-developed microcephalic Down's appearing well-nourished alert verbal in no acute distress cooperative with mom's  help   HEENT: normocephalic atraumatic , Eyes:  conjunctiva clear, Nares: paten,t no deformity discharge or tenderness., Ears: no deformity EAC's clear TMs with normal landmarks.  Moist mucous membranes. Dentition not fully examined CHEST/PULM:  Clear to auscultation and percussion breath sounds equal no wheeze , rales or rhonchi. No chest wall deformities or tenderness.  CV: PMI is nondisplaced, S1 S2 no gallops, 2/6 to 3/6 systolic murmur no, rubs. Peripheral pulses are full without delay.No JVD .  ABDOMEN: Bowel sounds normal nontender  No guard or rebound, no hepato splenomegal no CVA tenderness.  Extremtities:  No clubbing cyanosis or edema, no acute joint swelling or redness no focal atrophy flat feet Down's habitus short digits. NEURO: Nonverbal flexible ambulatory nonfocal SKIN: No acute rashes normal turgor, color, no bruising or petechiae.  LN: no cervical axillary inguinal adenopathy  Lab Results  Component Value Date   WBC 4.2 11/20/2021   HGB 16.2 11/20/2021   HCT 47.0 11/20/2021   PLT 204.0 11/20/2021   GLUCOSE 85 11/20/2021   CHOL 146 11/20/2021   TRIG 87.0 11/20/2021   HDL 34.70 (L) 11/20/2021   LDLCALC 93 11/20/2021   ALT 19 11/20/2021   AST 23 11/20/2021   NA 141 11/20/2021   K 3.9 11/20/2021   CL 104 11/20/2021   CREATININE 1.10 11/20/2021   BUN 21 11/20/2021   CO2 29 11/20/2021   TSH 1.22 11/20/2021   HGBA1C 4.4 (L) 11/20/2021    BP Readings from Last 3 Encounters:  11/21/21 110/70  04/04/21 100/66  11/28/20 110/60    Lab results reviewed with mom had QuantiFERON gold negative last year no exposures nor symptoms.  ASSESSMENT AND PLAN:  Discussed the following assessment and plan:    ICD-10-CM   1. Visit for preventive health examination  Z00.00     2. Down syndrome  Q90.9     3. Autism  F84.0     4. S/P atrioventricular septal defect repair  Z87.74     5. Adult congenital heart disease  Q24.9     Forms completed and signed for after  [facility]. Mom reported that he might be helped by additional occupational therapy but he is aged out.  We can do a referral if she reports locations is acceptable for him.  He does require help with some ADLs Return in about 1 year (around 11/22/2022).  Patient Care Team: Burnis Medin, MD as PCP - General (Internal Medicine) Jonah Blue, MD as Referring Physician (Pediatric  Cardiology) Patient Instructions  Lab is stable or normal . .  Form singed .  Glad we are doing well. Let us know if  we need to do any referrals or sign off for OT etc.    Standley Brooking. Valerie Fredin M.D.

## 2021-11-21 NOTE — Patient Instructions (Signed)
Lab is stable or normal . .  Form singed .  Glad we are doing well. Let us know if  we need to do any referrals or sign off for OT etc.

## 2021-11-22 ENCOUNTER — Telehealth: Payer: Self-pay | Admitting: Internal Medicine

## 2021-11-22 NOTE — Telephone Encounter (Signed)
Spoke to patient's mom. Made her aware that the missing form is at the front desk for her to pick up.

## 2021-11-22 NOTE — Telephone Encounter (Signed)
Pt's mom called to say she is missing a form that was given to [place] yesterday for copies.    Please call her ASAP, as she needs that form for Pt's day program.   Please call: [phone]

## 2022-04-02 ENCOUNTER — Telehealth: Payer: Self-pay | Admitting: Internal Medicine

## 2022-04-02 NOTE — Telephone Encounter (Signed)
Patient Social Worker Holley Bouche dropped off document  Medical Need Forms , to be filled out by provider. Patient requested to call Arbie Cookey within 2-days preferably no later than Monday. Document is located in providers tray at front office.  Please reach Holley Bouche by mobile when forms are complete: [phone]  Please advise

## 2022-04-02 NOTE — Telephone Encounter (Signed)
Pt dad peter call back and confirm ok for Holley Bouche to drop of medical forms

## 2022-04-02 NOTE — Telephone Encounter (Signed)
Called and LVM to confirm that Nathaniel Jimenez pt Social Worker can drop off Medical forms for Dr. To fill out.

## 2022-04-02 NOTE — Telephone Encounter (Signed)
Disregard

## 2022-04-03 NOTE — Telephone Encounter (Signed)
Contacted Holley Bouche. Left a detail message that form was completed and to call us back.

## 2022-04-03 NOTE — Telephone Encounter (Signed)
Form completed   Please copy for scan .

## 2022-04-04 NOTE — Telephone Encounter (Signed)
Form was given to FD, [place] to hand to Holley Bouche to pick up.

## 2022-04-04 NOTE — Telephone Encounter (Signed)
Attempt to reach [place]. Left a voicemail to call us back.

## 2022-07-02 ENCOUNTER — Telehealth: Payer: Self-pay | Admitting: Internal Medicine

## 2022-07-02 NOTE — Telephone Encounter (Signed)
Medicaid/State Serivce Form to be filled out--placed in dr's folder.  Mother will pick up upon completion- [phone]

## 2022-07-15 NOTE — Telephone Encounter (Signed)
Note has been signed last week and to your desk

## 2022-07-15 NOTE — Telephone Encounter (Signed)
Contacted pt. Mom answered (on DPR). She was aware and states they will pick it up today.

## 2022-08-02 ENCOUNTER — Telehealth: Payer: Self-pay | Admitting: Internal Medicine

## 2022-08-02 NOTE — Telephone Encounter (Signed)
Judeth Cornfield from [facility] called in checking on the status of a PA form that was sent on 6/24 & 7/1 concerning underwear & briefs.  Please advise.

## 2022-10-23 ENCOUNTER — Telehealth: Payer: Self-pay | Admitting: Internal Medicine

## 2022-10-23 DIAGNOSIS — R718 Other abnormality of red blood cells: Secondary | ICD-10-CM

## 2022-10-23 DIAGNOSIS — Z79899 Other long term (current) drug therapy: Secondary | ICD-10-CM

## 2022-10-23 DIAGNOSIS — F840 Autistic disorder: Secondary | ICD-10-CM

## 2022-10-23 DIAGNOSIS — E786 Lipoprotein deficiency: Secondary | ICD-10-CM

## 2022-10-23 DIAGNOSIS — Q909 Down syndrome, unspecified: Secondary | ICD-10-CM

## 2022-10-23 NOTE — Telephone Encounter (Signed)
Pt's last CPE took place on 11/21/21. This year's CPE was scheduled for Wednesday, 11/27/22 at 3 pm. Pt is Special Needs and cannot fast until 3 pm. Mother would like to bring Pt back in the following morning after CPE  (Thursday, 11/28/22 at 8 am) for his labs.  Is provider okay with this request?

## 2022-10-23 NOTE — Telephone Encounter (Signed)
Sure we can do this.  I placed future order and can ge these the day before or the day after  Can make lab appt for either day

## 2022-10-24 NOTE — Telephone Encounter (Signed)
Parents informed, and are very grateful.  Pt will come in to do labs at 8 am on 11/28/22.

## 2022-11-27 ENCOUNTER — Ambulatory Visit (INDEPENDENT_AMBULATORY_CARE_PROVIDER_SITE_OTHER): Payer: MEDICAID | Admitting: Internal Medicine

## 2022-11-27 ENCOUNTER — Encounter: Payer: Self-pay | Admitting: Internal Medicine

## 2022-11-27 VITALS — BP 110/64 | HR 73 | Temp 97.4°F | Ht <= 58 in | Wt 106.6 lb

## 2022-11-27 DIAGNOSIS — Z79899 Other long term (current) drug therapy: Secondary | ICD-10-CM

## 2022-11-27 DIAGNOSIS — Q909 Down syndrome, unspecified: Secondary | ICD-10-CM | POA: Diagnosis not present

## 2022-11-27 DIAGNOSIS — G4730 Sleep apnea, unspecified: Secondary | ICD-10-CM

## 2022-11-27 DIAGNOSIS — Z0000 Encounter for general adult medical examination without abnormal findings: Secondary | ICD-10-CM | POA: Diagnosis not present

## 2022-11-27 DIAGNOSIS — Q249 Congenital malformation of heart, unspecified: Secondary | ICD-10-CM | POA: Diagnosis not present

## 2022-11-27 DIAGNOSIS — Z9049 Acquired absence of other specified parts of digestive tract: Secondary | ICD-10-CM | POA: Diagnosis not present

## 2022-11-27 DIAGNOSIS — Z789 Other specified health status: Secondary | ICD-10-CM

## 2022-11-27 DIAGNOSIS — F458 Other somatoform disorders: Secondary | ICD-10-CM

## 2022-11-27 NOTE — Progress Notes (Signed)
Chief Complaint  Patient presents with   Annual Exam   HPI: Patient  Nathaniel Jimenez  32 y.o. with trisomy 20 autistic non verbal behabiour comes in today for Preventive Health Care visit   with mom  and forms  for [facility] which he att[facility] 5 d per week 9- 3 and does well there  Under care for [facility]. Stable  seeing  yearly . Mom feels ? If sleep apnea .  Breathing pauses at night . Problem ? Allergy teeth  grinding   is worse ( baseline and seems like hurt head at night   mom says some blood when clearing teeth but dentis has said no problem? trying allegra  peds 12 dose  ? Asks about adding decongestants.     Health Maintenance  Topic Date Due   COVID-19 Vaccine (3 - Pfizer risk series) 12/13/2022 (Originally 06/02/2019)   INFLUENZA VACCINE  04/21/2023 (Originally 08/22/2022)   Hepatitis C Screening  11/27/2023 (Originally 12/28/2010)   HIV Screening  11/27/2023 (Originally 12/28/2007)   DTaP/Tdap/Td (3 - Td or Tdap) 11/09/2026   HPV VACCINES  Aged Out   Lives split between  parents  sun th and tues to Sat    ROS:  Food intolerances  listed  some gerd .  No vomiting   Past Medical History:  Diagnosis Date   Autism    Down syndrome    Heart murmur     Past Surgical History:  Procedure Laterality Date   CARDIAC SURGERY     x2 - One at 3 months and one at 5 months   CHOLECYSTECTOMY N/A 02/19/2018   Procedure: LAPAROSCOPIC CHOLECYSTECTOMY;  Surgeon: Abigail Miyamoto, MD;  Location: [facility] OR;  Service: General;  Laterality: N/A;   DENTAL SURGERY      Family History  Problem Relation Age of Onset   Healthy Mother    Healthy Father    Cancer Maternal Grandmother        Merkle cell, skin cancer   Osteoarthritis Maternal Grandmother    Osteoporosis Maternal Grandmother    Bladder Cancer Maternal Grandfather    Healthy Paternal Grandmother    Healthy Paternal Grandfather     Social History   Socioeconomic History   Marital status: Single    Spouse name: Not on file    Number of children: 0   Years of education: Not on file   Highest education level: Not on file  Occupational History   Occupation: disabled  Tobacco Use   Smoking status: Never   Smokeless tobacco: Never  Vaping Use   Vaping status: Never Used  Substance and Sexual Activity   Alcohol use: No    Alcohol/week: 0.0 standard drinks of alcohol   Drug use: No   Sexual activity: Not on file  Other Topics Concern   Not on file  Social History Narrative   Lives with parents and is non-verbal, autistic and has down syndrome.    Born in [place] had heart surgery yet L lived in [place] outside of [place] before moving to [place] with family. For fathers job Acupuncturist.    Is in [facility].      Social Determinants of Health   Financial Resource Strain: Not on file  Food Insecurity: Not on file  Transportation Needs: Not on file  Physical Activity: Sufficiently Active (11/27/2022)   Exercise Vital Sign    Days of Exercise per Week: 7 days    Minutes of Exercise per Session: 30  min  Stress: Not on file  Social Connections: Not on file    Outpatient Medications Prior to Visit  Medication Sig Dispense Refill   Fexofenadine HCl (ALLEGRA PO) Take by mouth.     No facility-administered medications prior to visit.     EXAM:  BP 110/64 (BP Location: Right Arm, Patient Position: Sitting, Cuff Size: Normal) Comment: unable to obtain. pt is unable to cooperate  Pulse 73   Temp (!) 97.4 F (36.3 C) (Temporal)   Ht 4\' 10"  (1.473 m)   Wt 106 lb 9.6 oz (48.4 kg)   SpO2 93%   BMI 22.28 kg/m   Body mass index is 22.28 kg/m. Wt Readings from Last 3 Encounters:  11/27/22 106 lb 9.6 oz (48.4 kg)  11/21/21 105 lb 12.8 oz (48 kg)  04/04/21 105 lb (47.6 kg)    Physical Exam: Vital signs reviewed [id number] is  down habitus non verbal but cooperative with parent   in no acute distress.  HEENT: normocephalic atraumatic , Eyes: PERRL EOM's full, conjunctiva  clear, Nares: paten,t no deformity discharge or tenderness.teeth grinding large tongue downs habitus  NECK: supple without masses, thyromegaly or bruits. CHEST/PULM:  Clear to auscultation and percussion breath sounds equal no wheeze , rales or rhonchi.  CV: PMI is nondisplaced, S1 S2  left sided murmur   Peripheral pulses are present delay. ABDOMEN: Bowel sounds normal nontender  No guard or rebound, no hepato splenomegal no CVA tenderness.  Extremtities:  No clubbing cyanosis or edema, no acute joint swelling or redness no focal atrophy hand digit habitus typical  NEURO:  no obvious focal weakness,gait flat foot  stable SKIN: No acute rashes normal turgor, color, no bruising or petechiae. No sores ulcers noted  noted  PSYCH: non verbal  nl interaction at baseline  attends to mom  LN: no cervical axillary adenopathy  Lab Results  Component Value Date   WBC 4.2 11/20/2021   HGB 16.2 11/20/2021   HCT 47.0 11/20/2021   PLT 204.0 11/20/2021   GLUCOSE 85 11/20/2021   CHOL 146 11/20/2021   TRIG 87.0 11/20/2021   HDL 34.70 (L) 11/20/2021   LDLCALC 93 11/20/2021   ALT 19 11/20/2021   AST 23 11/20/2021   NA 141 11/20/2021   K 3.9 11/20/2021   CL 104 11/20/2021   CREATININE 1.10 11/20/2021   BUN 21 11/20/2021   CO2 29 11/20/2021   TSH 1.22 11/20/2021   HGBA1C 4.4 (L) 11/20/2021    BP Readings from Last 3 Encounters:  11/27/22 110/64  11/21/21 110/70  04/04/21 100/66    Lab plan  reviewed with patient  appt for  later this week.  ASSESSMENT AND PLAN:  Discussed the following assessment and plan:    ICD-10-CM   1. Visit for preventive health examination  Z00.00     2. Down syndrome  Q90.9     3. Discomfort  Z78.9    ? night  related ? if dental other  ok to try allegry adult dose and acetomenophen at night    4. Adult congenital heart disease  Q24.9     5. S/P laparoscopic cholecystectomy  Z90.49     6. Medication management  Z79.899     7. Grinding of teeth  F45.8      8. Sleep disorder breathing ?  G47.30    uncertain if significant     Form reviewed and  signed for [facility].  Over all gi seems to be stable  after cholescytectomy  a few year ago  but has some reflux  Uncertain what  night discomfort is  see dentist  consider seeing ent  if appropriate  Return in about 1 year (around 11/27/2023) for depending on results.  Patient Care Team: Madelin Headings, MD as PCP - General (Internal Medicine) Hilton Cork, MD as Referring Physician (Pediatric Cardiology) Patient Instructions  Ok to try adult allegra   Acetaminophen  may before night  in case pain teeth a bother  At this time would avoid   decongestants.   Consider ent evaluation  consider dental other pain and sleep  apnea issues .  Lab this week .  Fu as indicated and one year .  Neta Mends. Taneah Masri M.D.

## 2022-11-27 NOTE — Patient Instructions (Addendum)
Ok to try adult allegra   Acetaminophen  may before night  in case pain teeth a bother  At this time would avoid   decongestants.   Consider ent evaluation  consider dental other pain and sleep  apnea issues .  Lab this week .  Fu as indicated and one year .

## 2022-11-28 ENCOUNTER — Other Ambulatory Visit: Payer: Commercial Managed Care - HMO

## 2022-11-29 ENCOUNTER — Other Ambulatory Visit (INDEPENDENT_AMBULATORY_CARE_PROVIDER_SITE_OTHER): Payer: MEDICAID

## 2022-11-29 DIAGNOSIS — F840 Autistic disorder: Secondary | ICD-10-CM

## 2022-11-29 DIAGNOSIS — Q909 Down syndrome, unspecified: Secondary | ICD-10-CM

## 2022-11-29 DIAGNOSIS — R718 Other abnormality of red blood cells: Secondary | ICD-10-CM | POA: Diagnosis not present

## 2022-11-29 DIAGNOSIS — E786 Lipoprotein deficiency: Secondary | ICD-10-CM

## 2022-11-29 DIAGNOSIS — Z79899 Other long term (current) drug therapy: Secondary | ICD-10-CM | POA: Diagnosis not present

## 2022-11-29 LAB — CBC WITH DIFFERENTIAL/PLATELET
Basophils Absolute: 0.1 10*3/uL (ref 0.0–0.1)
Basophils Relative: 2.8 % (ref 0.0–3.0)
Eosinophils Absolute: 0 10*3/uL (ref 0.0–0.7)
Eosinophils Relative: 1.2 % (ref 0.0–5.0)
HCT: 47.5 % (ref 39.0–52.0)
Hemoglobin: 16.3 g/dL (ref 13.0–17.0)
Lymphocytes Relative: 27.8 % (ref 12.0–46.0)
Lymphs Abs: 1.1 10*3/uL (ref 0.7–4.0)
MCHC: 34.3 g/dL (ref 30.0–36.0)
MCV: 100.8 fL — ABNORMAL HIGH (ref 78.0–100.0)
Monocytes Absolute: 0.4 10*3/uL (ref 0.1–1.0)
Monocytes Relative: 8.8 % (ref 3.0–12.0)
Neutro Abs: 2.4 10*3/uL (ref 1.4–7.7)
Neutrophils Relative %: 59.4 % (ref 43.0–77.0)
Platelets: 202 10*3/uL (ref 150.0–400.0)
RBC: 4.72 Mil/uL (ref 4.22–5.81)
RDW: 13.3 % (ref 11.5–15.5)
WBC: 4.1 10*3/uL (ref 4.0–10.5)

## 2022-11-29 LAB — LIPID PANEL
Cholesterol: 144 mg/dL (ref 0–200)
HDL: 33.6 mg/dL — ABNORMAL LOW (ref >39.00)
LDL Cholesterol: 88 mg/dL (ref 0–99)
NonHDL: 110.81
Total CHOL/HDL Ratio: 4
Triglycerides: 115 mg/dL (ref 0.0–149.0)
VLDL: 23 mg/dL (ref 0.0–40.0)

## 2022-11-29 LAB — BASIC METABOLIC PANEL
BUN: 19 mg/dL (ref 6–23)
CO2: 30 meq/L (ref 19–32)
Calcium: 9.1 mg/dL (ref 8.4–10.5)
Chloride: 103 meq/L (ref 96–112)
Creatinine, Ser: 1.31 mg/dL (ref 0.40–1.50)
GFR: 73.35 mL/min (ref >60.00)
Glucose, Bld: 84 mg/dL (ref 70–99)
Potassium: 4.2 meq/L (ref 3.5–5.1)
Sodium: 141 meq/L (ref 135–145)

## 2022-11-29 LAB — HEPATIC FUNCTION PANEL
ALT: 23 U/L (ref 0–53)
AST: 22 U/L (ref 0–37)
Albumin: 4.4 g/dL (ref 3.5–5.2)
Alkaline Phosphatase: 70 U/L (ref 39–117)
Bilirubin, Direct: 0.3 mg/dL (ref 0.0–0.3)
Total Bilirubin: 2 mg/dL — ABNORMAL HIGH (ref 0.2–1.2)
Total Protein: 7.2 g/dL (ref 6.0–8.3)

## 2022-11-29 LAB — TSH: TSH: 1.27 u[IU]/mL (ref 0.35–5.50)

## 2022-11-29 LAB — T4, FREE: Free T4: 0.85 ng/dL (ref 0.60–1.60)

## 2022-11-29 LAB — HEMOGLOBIN A1C: Hgb A1c MFr Bld: 4.4 % — ABNORMAL LOW (ref 4.6–6.5)

## 2022-12-02 NOTE — Progress Notes (Signed)
Thyroid in range  , no anemia  cholesterol about the same  good ldl hdl slightly low . Influenced by exercise .   No diabetes or anemia . Other out of range   stable

## 2022-12-03 ENCOUNTER — Telehealth: Payer: Self-pay | Admitting: Internal Medicine

## 2022-12-03 NOTE — Telephone Encounter (Signed)
Mother returned call regarding lab results

## 2022-12-05 NOTE — Telephone Encounter (Signed)
Spoke to pt's mom and went over lab result. See chart on result note.

## 2022-12-18 NOTE — Progress Notes (Signed)
So physical activity can elevated hdl in some people  .  Hdl not as effected by diet .  So  staying  physically active l as possible  is healthy.  If he likes some type of safe adaptive exercise  avoiding injury then that is helpful.

## 2023-02-07 ENCOUNTER — Encounter: Payer: Self-pay | Admitting: Internal Medicine

## 2023-02-07 DIAGNOSIS — F458 Other somatoform disorders: Secondary | ICD-10-CM

## 2023-02-12 NOTE — Telephone Encounter (Signed)
Attempt to reach pt to confirm on the diagnose. Left a voicemail.

## 2023-02-14 NOTE — Telephone Encounter (Signed)
Spoke to mom and confirm the diagnose with grinding teeth. Referral is sent.   No further action is needed.

## 2023-07-07 ENCOUNTER — Ambulatory Visit (INDEPENDENT_AMBULATORY_CARE_PROVIDER_SITE_OTHER): Payer: MEDICAID

## 2023-07-07 ENCOUNTER — Encounter: Payer: Self-pay | Admitting: Family Medicine

## 2023-07-07 ENCOUNTER — Ambulatory Visit (INDEPENDENT_AMBULATORY_CARE_PROVIDER_SITE_OTHER): Payer: MEDICAID | Admitting: Family Medicine

## 2023-07-07 VITALS — BP 106/60

## 2023-07-07 DIAGNOSIS — R269 Unspecified abnormalities of gait and mobility: Secondary | ICD-10-CM

## 2023-07-07 NOTE — Telephone Encounter (Signed)
 Noted.  That is good news.  Spoke with patient's mom.  X-ray shows no acute abnormality.  This will be over read.  Nathaniel Sit MD [facility]

## 2023-07-07 NOTE — Progress Notes (Signed)
 Established Patient Office Visit  Subjective   Patient ID: Nathaniel Jimenez, male    DOB: [birth year]  Age: 33 y.o. MRN: [id number]  Chief Complaint  Patient presents with   Hip Injury    HPI   Nathaniel Jimenez is seen today companied by parents.  He has history of Down syndrome and is essentially nonverbal in communication.  He also has history of ASD, subaortic stenosis, autism.  Parents noted on the weekend that he seemed to be limping somewhat and favoring the right lower extremity.  No known injury.  Saturday he had gone paddle boarding.  He frequently sits with his legs crossed and can apply pressure to the right lateral knee region sometimes for hours.  Seem to be dragging the right lower extremity somewhat with walking yesterday but some improved today.  They wondered if he may have had some sort of hip injury although he has not seen uncomfortable.  They have not noted any visible swelling or redness or warmth.  No fever.  Past Medical History:  Diagnosis Date   Autism    Down syndrome    Heart murmur    Past Surgical History:  Procedure Laterality Date   CARDIAC SURGERY     x2 - One at 3 months and one at 36 months   CHOLECYSTECTOMY N/A 02/19/2018   Procedure: LAPAROSCOPIC CHOLECYSTECTOMY;  Surgeon: Oza Blumenthal, MD;  Location: [facility] OR;  Service: General;  Laterality: N/A;   DENTAL SURGERY      reports that he has never smoked. He has never used smokeless tobacco. He reports that he does not drink alcohol and does not use drugs. family history includes Bladder Cancer in his maternal grandfather; Cancer in his maternal grandmother; Healthy in his father, mother, paternal grandfather, and paternal grandmother; Osteoarthritis in his maternal grandmother; Osteoporosis in his maternal grandmother. No Known Allergies  Review of Systems  Constitutional:  Negative for chills and fever.  Neurological:  Negative for focal weakness.      Objective:     BP 106/60 (BP Location: Right  Arm, Patient Position: Sitting, Cuff Size: Normal)  BP Readings from Last 3 Encounters:  07/07/23 106/60  11/27/22 110/64  11/21/21 110/70   Wt Readings from Last 3 Encounters:  11/27/22 106 lb 9.6 oz (48.4 kg)  11/21/21 105 lb 12.8 oz (48 kg)  04/04/21 105 lb (47.6 kg)      Physical Exam Vitals reviewed.  Constitutional:      General: He is not in acute distress.  Musculoskeletal:     Right lower leg: No edema.     Left lower leg: No edema.     Comments: Somewhat uncooperative during exam but we were able to get him up on the table and he has fairly good rotation of the right hip medially and laterally.  No obvious pain.  No obvious or visible swelling of the right lower extremity.   Neurological:     Mental Status: He is alert.      No results found for any visits on 07/07/23.    The ASCVD Risk score (Arnett DK, et al., 2019) failed to calculate for the following reasons:   The 2019 ASCVD risk score is only valid for ages 97 to 32    Assessment & Plan:   Problem List Items Addressed This Visit   None Visit Diagnoses       Altered gait    -  Primary   Relevant Orders   DG Hip Unilat W  OR W/O Pelvis 2-3 Views Right     We watched his gait and looked initially like he may have a little bit of a foot drop on the right side.  However, with further testing he seemed to have some strength with dorsi flexion, though perhaps slightly weaker than the left side but testing is difficult because of his his poor cooperation with his Down syndrome.  He did not seem to have difficulty bearing weight on the right lower extremity.  We were able to get x-rays of his right hip and no dislocation.  No obvious acute bony abnormalities of the pelvis or hip.  This will be over read.  We did discuss issues of trying to avoid prolonged or excessive pressure on the peroneal nerve by leg crossing for long periods.  Observe over the next few days and be in touch if he is not getting back to  normal gait  No follow-ups on file.   I spoke with mom after visit and they sent back a note stating that he was able to swim after the visit and negotiate steps without difficulty so seems to be improving already. Glean Lamy, MD

## 2023-07-08 ENCOUNTER — Telehealth: Payer: Self-pay

## 2023-07-08 NOTE — Telephone Encounter (Signed)
 Copied from CRM [id number]. Topic: General - Other >> Jul 08, 2023 11:41 AM Adonis Hoot wrote: Reason for CRM: Patients mom called in stating that after [facility] faxed over a form on last week Thursday for provider to sign off on,and they haven't received form back just yet. She would like to know the status of this form. Mom stated that she is able to pick up form as well if that is more convenient to ensure that they receive it back at [facility].

## 2023-07-08 NOTE — Telephone Encounter (Signed)
 Received the form and printed.   Contacted pt's guardian. Spoke to [place], on PDR. Ask him if this can wait for Dr. Ethel Henry this Thursday. Donata Fryer states that would be fine. He request for it be fax over the company and to give them an update.   Form place in provider's red folder.

## 2023-07-10 NOTE — Telephone Encounter (Signed)
 Form was signed by provider and faxed over. Received a confirmation.   Attempted to follow up with pt's fatherDonata Fryer. Left a detail message that form was faxed. To give us  a call if have any questions.   Forwarding to Dr. Ethel Henry.

## 2023-07-16 ENCOUNTER — Ambulatory Visit: Payer: Self-pay | Admitting: Family Medicine

## 2023-12-02 ENCOUNTER — Encounter: Payer: Self-pay | Admitting: Internal Medicine

## 2023-12-02 ENCOUNTER — Ambulatory Visit: Admitting: Internal Medicine

## 2023-12-02 VITALS — BP 96/54 | Temp 98.3°F | Ht <= 58 in | Wt 99.0 lb

## 2023-12-02 DIAGNOSIS — Z79899 Other long term (current) drug therapy: Secondary | ICD-10-CM | POA: Diagnosis not present

## 2023-12-02 DIAGNOSIS — Q249 Congenital malformation of heart, unspecified: Secondary | ICD-10-CM

## 2023-12-02 DIAGNOSIS — F840 Autistic disorder: Secondary | ICD-10-CM | POA: Diagnosis not present

## 2023-12-02 DIAGNOSIS — Q909 Down syndrome, unspecified: Secondary | ICD-10-CM

## 2023-12-02 DIAGNOSIS — R718 Other abnormality of red blood cells: Secondary | ICD-10-CM

## 2023-12-02 DIAGNOSIS — Z0000 Encounter for general adult medical examination without abnormal findings: Secondary | ICD-10-CM

## 2023-12-02 NOTE — Progress Notes (Signed)
 Chief Complaint  Patient presents with   Annual Exam    HPI: Patient  Nathaniel Jimenez  33 y.o. comes in today for Preventive Health Care visit  Here with mom and has  form for [facility]   [name]  health has been stable . Developmental state about like a 17 mos old  or so  He likes the care program and around people  no sig injury  this year  did have a dental extraction that was a difficult procedure but ok  Sees dentist  probably has osa but not a candidate for  evaluation  sleep son side to avoid  snoring Sees cards q years for  chd av canal  repair  has no sx   and gets  regular echo fu as indicated .  Has dietary particulars    and mom has a list . Has had testing for celia cin past and  still has ocass rumination ( had esophageal stretching in past and cholecystectomy   Health Maintenance  Topic Date Due   COVID-19 Vaccine (3 - Pfizer risk series) 12/18/2023 (Originally 06/02/2019)   Influenza Vaccine  04/20/2024 (Originally 08/22/2023)   Pneumococcal Vaccine (1 of 2 - PCV) 12/01/2024 (Originally 12/28/2011)   HPV VACCINES (1 - 3-dose SCDM series) 12/01/2024 (Originally 12/28/2019)   Hepatitis C Screening  12/01/2024 (Originally 12/28/2010)   HIV Screening  12/01/2024 (Originally 12/28/2007)   DTaP/Tdap/Td (3 - Td or Tdap) 11/09/2026   Hepatitis B Vaccines 19-59 Average Risk  Completed   Meningococcal B Vaccine  Aged Out   Health Maintenance Review LIFESTYLE:  sleeps ok  can use toilet but  not reliable continent so wears  pads and needs help to change  Spit care between  father and moms residence as they are divorced .   ROS:  See hpi    Past Medical History:  Diagnosis Date   Autism    Down syndrome    Heart murmur     Past Surgical History:  Procedure Laterality Date   CARDIAC SURGERY     x2 - One at 3 months and one at 75 months   CHOLECYSTECTOMY N/A 02/19/2018   Procedure: LAPAROSCOPIC CHOLECYSTECTOMY;  Surgeon: Vernetta Berg, MD;  [facility];   Service: General;  Laterality: N/A;   DENTAL SURGERY      Family History  Problem Relation Age of Onset   Healthy Mother    Healthy Father    Cancer Maternal Grandmother        Merkle cell, skin cancer   Osteoarthritis Maternal Grandmother    Osteoporosis Maternal Grandmother    Bladder Cancer Maternal Grandfather    Healthy Paternal Grandmother    Healthy Paternal Grandfather     Social History   Socioeconomic History   Marital status: Single    Spouse name: Not on file   Number of children: 0   Years of education: Not on file   Highest education level: Not on file  Occupational History   Occupation: disabled  Tobacco Use   Smoking status: Never   Smokeless tobacco: Never  Vaping Use   Vaping status: Never Used  Substance and Sexual Activity   Alcohol use: No    Alcohol/week: 0.0 standard drinks of alcohol   Drug use: No   Sexual activity: Not on file  Other Topics Concern   Not on file  Social History Narrative   Lives with parents and is non-verbal, autistic and has down syndrome.  Born in [place]  had heart surgery yet L lived in [place]  outside of [place] before moving to [place]  with family. For fathers job acupuncturist.    Is in [facility].      Social Drivers of Corporate Investment Banker Strain: Not on file  Food Insecurity: Not on file  Transportation Needs: Not on file  Physical Activity: Sufficiently Active (11/27/2022)   Exercise Vital Sign    Days of Exercise per Week: 7 days    Minutes of Exercise per Session: 30 min  Stress: Not on file  Social Connections: Not on file    Outpatient Medications Prior to Visit  Medication Sig Dispense Refill   Fexofenadine HCl (ALLEGRA PO) Take by mouth.     No facility-administered medications prior to visit.     EXAM:  BP (!) 96/54 (BP Location: Right Arm, Patient Position: Sitting, Cuff Size: Normal)   Temp 98.3 F (36.8 C) (Oral)   Ht 4' 10 (1.473 m)   Wt 99  lb (44.9 kg)   BMI 20.69 kg/m   Body mass index is 20.69 kg/m. Wt Readings from Last 3 Encounters:  12/02/23 99 lb (44.9 kg)  11/27/22 106 lb 9.6 oz (48.4 kg)  11/21/21 105 lb 12.8 oz (48 kg)    Physical Exam: Vital signs reviewed HZW:[id number] is a alert  non verbal downs habitus  young adult  person in nad   can cooperate some with mom help  .  Motor activity  no obv resp distress or pain  HEENT:microcephalic atraumatic , Eyes: PERRL  conjunctiva clear, Nares: paten,t no deformity discharge or tenderness., Ears: no deformity EAC's clear min wax TMs with normal landmarks. Mouth: not examined  promint tongue  NECK: no obv masses  CHEST/PULM:  Clear to auscultation and percussion breath sounds equal no wheeze , rales or rhonchi. . CV: PMI is nondisplaced, pulse RR S1 S2 no gallops, 2/6 systolic murmurs, no  rubs. No edema no JVD  ABDOMEN: Bowel sounds normal nontender  No guard or rebound, no hepato splenomegal no CVA tenderness.  No obvious  hernia. Examined stand sitt  cause of cooperation Extremtities:  No clubbing cyanosis or edema, no acute joint swelling or redness no focal atrophy NEURO:  non focal  independent gait  out toeing  gait  SKIN: No acute rashes normal turgor, color, no bruising or petechiae. GU deferred   disc  with mom and ok  LN: no cervical adenopathy  Lab Results  Component Value Date   WBC 4.1 11/29/2022   HGB 16.3 11/29/2022   HCT 47.5 11/29/2022   PLT 202.0 11/29/2022   GLUCOSE 84 11/29/2022   CHOL 144 11/29/2022   TRIG 115.0 11/29/2022   HDL 33.60 (L) 11/29/2022   LDLCALC 88 11/29/2022   ALT 23 11/29/2022   AST 22 11/29/2022   NA 141 11/29/2022   K 4.2 11/29/2022   CL 103 11/29/2022   CREATININE 1.31 11/29/2022   BUN 19 11/29/2022   CO2 30 11/29/2022   TSH 1.27 11/29/2022   HGBA1C 4.4 (L) 11/29/2022   Lab Results  Component Value Date   VITAMINB12 594 11/20/2021      BP Readings from Last 3 Encounters:  12/02/23 (!) 96/54  07/07/23 106/60   11/27/22 110/64    Lab plan  reviewed with mom   ASSESSMENT AND PLAN:  Discussed the following assessment and plan:    ICD-10-CM   1. Visit for preventive health examination  Z00.00 Basic metabolic  panel with GFR    CBC with Differential/Platelet    Hemoglobin A1c    Hepatic function panel    Lipid panel    TSH    T4, Free    2. Down syndrome  Q90.9 Basic metabolic panel with GFR    CBC with Differential/Platelet    Hemoglobin A1c    Hepatic function panel    Lipid panel    TSH    T4, Free    3. Autism  F84.0 Basic metabolic panel with GFR    CBC with Differential/Platelet    Hemoglobin A1c    Hepatic function panel    Lipid panel    TSH    T4, Free    4. Adult congenital heart disease  Q24.9 Basic metabolic panel with GFR    CBC with Differential/Platelet    Hemoglobin A1c    Hepatic function panel    Lipid panel    TSH    T4, Free    5. Medication management  Z79.899 Basic metabolic panel with GFR    CBC with Differential/Platelet    Hemoglobin A1c    Hepatic function panel    Lipid panel    TSH    T4, Free    6. Elevated MCV  R71.8 Basic metabolic panel with GFR    CBC with Differential/Platelet    Hemoglobin A1c    Hepatic function panel    Lipid panel    TSH    T4, Free    Forms completed signed  Ocass monitoring for  cbc   thyroid  etc  today as possible  (can delay if  needed ) Declined influenza vaccine today   No follow-ups on file.  Patient Care Team: Charlett Apolinar POUR, MD as PCP - General (Internal Medicine) Dianah Charlie Barter, MD as Referring Physician (Pediatric Cardiology) Patient Instructions  Good to see you today .  Lab if possible . Yearly check or as indicated .   Jacquiline Zurcher K. Kaylena Pacifico M.D.

## 2023-12-02 NOTE — Patient Instructions (Signed)
 Good to see you today .  Lab if possible . Yearly check or as indicated .

## 2023-12-03 LAB — CBC WITH DIFFERENTIAL/PLATELET
Basophils Absolute: 0.1 K/uL (ref 0.0–0.1)
Basophils Relative: 1.3 % (ref 0.0–3.0)
Eosinophils Absolute: 0 K/uL (ref 0.0–0.7)
Eosinophils Relative: 0.6 % (ref 0.0–5.0)
HCT: 42.5 % (ref 39.0–52.0)
Hemoglobin: 14.8 g/dL (ref 13.0–17.0)
Lymphocytes Relative: 23.1 % (ref 12.0–46.0)
Lymphs Abs: 1.3 K/uL (ref 0.7–4.0)
MCHC: 34.9 g/dL (ref 30.0–36.0)
MCV: 99.5 fl (ref 78.0–100.0)
Monocytes Absolute: 0.4 K/uL (ref 0.1–1.0)
Monocytes Relative: 6.5 % (ref 3.0–12.0)
Neutro Abs: 3.8 K/uL (ref 1.4–7.7)
Neutrophils Relative %: 68.5 % (ref 43.0–77.0)
Platelets: 229 K/uL (ref 150.0–400.0)
RBC: 4.27 Mil/uL (ref 4.22–5.81)
RDW: 12.9 % (ref 11.5–15.5)
WBC: 5.6 K/uL (ref 4.0–10.5)

## 2023-12-03 LAB — BASIC METABOLIC PANEL WITH GFR
BUN: 13 mg/dL (ref 6–23)
CO2: 30 meq/L (ref 19–32)
Calcium: 8.8 mg/dL (ref 8.4–10.5)
Chloride: 102 meq/L (ref 96–112)
Creatinine, Ser: 1.11 mg/dL (ref 0.40–1.50)
GFR: 88.84 mL/min (ref >60.00)
Glucose, Bld: 84 mg/dL (ref 70–99)
Potassium: 4 meq/L (ref 3.5–5.1)
Sodium: 140 meq/L (ref 135–145)

## 2023-12-03 LAB — HEPATIC FUNCTION PANEL
ALT: 24 U/L (ref 0–53)
AST: 23 U/L (ref 0–37)
Albumin: 4.3 g/dL (ref 3.5–5.2)
Alkaline Phosphatase: 68 U/L (ref 39–117)
Bilirubin, Direct: 0.2 mg/dL (ref 0.0–0.3)
Total Bilirubin: 1.4 mg/dL — ABNORMAL HIGH (ref 0.2–1.2)
Total Protein: 6.8 g/dL (ref 6.0–8.3)

## 2023-12-03 LAB — LIPID PANEL
Cholesterol: 135 mg/dL (ref 0–200)
HDL: 34.9 mg/dL — ABNORMAL LOW (ref >39.00)
LDL Cholesterol: 84 mg/dL (ref 0–99)
NonHDL: 100.05
Total CHOL/HDL Ratio: 4
Triglycerides: 81 mg/dL (ref 0.0–149.0)
VLDL: 16.2 mg/dL (ref 0.0–40.0)

## 2023-12-03 LAB — HEMOGLOBIN A1C: Hgb A1c MFr Bld: 4.3 % — ABNORMAL LOW (ref 4.6–6.5)

## 2023-12-03 LAB — T4, FREE: Free T4: 0.57 ng/dL — ABNORMAL LOW (ref 0.60–1.60)

## 2023-12-03 LAB — TSH: TSH: 1.63 u[IU]/mL (ref 0.35–5.50)

## 2023-12-04 ENCOUNTER — Ambulatory Visit: Payer: Self-pay | Admitting: Internal Medicine

## 2023-12-04 ENCOUNTER — Telehealth: Payer: Self-pay | Admitting: Internal Medicine

## 2023-12-04 DIAGNOSIS — Q909 Down syndrome, unspecified: Secondary | ICD-10-CM

## 2023-12-04 DIAGNOSIS — R7989 Other specified abnormal findings of blood chemistry: Secondary | ICD-10-CM

## 2023-12-04 NOTE — Telephone Encounter (Signed)
 Lab order is placed.  Left a voicemail for pt's guardian to call us  back.

## 2023-12-04 NOTE — Progress Notes (Signed)
 Lab stable or ok except slightly low t4   this should be followed up  since downs condition can be associated with thyroid  dysfunction. Although may not be effecting him now .  Arrange for  repeat thyroid  test .  tsh ,free t4  free t3 and prolactin   dx low t4  We may  have an endocrine consult to be complete .

## 2023-12-04 NOTE — Telephone Encounter (Signed)
 Copied from CRM #[id number]. Topic: Appointments - Scheduling Inquiry for Clinic >> Dec 04, 2023  1:18 PM Drema MATSU wrote: Reason for CRM: Patient mom wants to schedule for recheck of labs per test results for pt but there is no order for it.

## 2023-12-05 ENCOUNTER — Telehealth: Payer: Self-pay

## 2023-12-05 NOTE — Telephone Encounter (Signed)
 Copied from CRM #[id number]. Topic: Clinical - Lab/Test Results >> Dec 04, 2023 12:55 PM Alfonso ORN wrote: Reason for CRM: pt mom called requesting f/u on lab results for pt once reviewed by pcp as she had concerns regarding results.

## 2023-12-09 NOTE — Telephone Encounter (Signed)
 I would  do the lab work  in the next month  . Please do endocrine referral  dx downs syndrome and   low t4 level

## 2023-12-09 NOTE — Telephone Encounter (Signed)
 Follow up with pt's mom. She wants to know how soon does the recheck needs to be done. Also she states they are interested in the endocrinology consult.   Please advise.

## 2023-12-09 NOTE — Telephone Encounter (Signed)
 Referral is placed and lab appt is set. No further action is needed.

## 2024-01-01 ENCOUNTER — Ambulatory Visit: Payer: Self-pay

## 2024-01-01 NOTE — Telephone Encounter (Signed)
 FYI Only or Action Required?: FYI only for provider: appointment scheduled on 01/02/24.  Patient was last seen in primary care on 12/02/2023 by Panosh, Apolinar POUR, MD.  Called Nurse Triage reporting Fever.  Symptoms began several days ago.  Interventions attempted: OTC medications: Ibuprofen.  Symptoms are: unchanged.  Triage Disposition: See Physician Within 24 Hours  Patient/caregiver understands and will follow disposition?: Yes  Reason for Disposition  Fever present > 3 days (72 hours)  Answer Assessment - Initial Assessment Questions Patient's mom reporting it being intermittent, happening in the AM and at night. Home covid test negative. Reports he goes to a day program, no notice given that anyone is sick.  1. TEMPERATURE: What is the most recent temperature?  How was it measured?      101  2. ONSET: When did the fever start?      Yesterday morning   3. CHILLS: Do you have chills? If yes: How bad are they?  (e.g., none, mild, moderate, severe)     Yes  4. OTHER SYMPTOMS: Do you have any other symptoms besides the fever?  (e.g., abdomen pain, cough, diarrhea, earache, headache, sore throat, urination pain)     Some nasal congestion, denies any other symptoms.  5. CAUSE: If there are no symptoms, ask: What do you think is causing the fever?      Unsure  6. CONTACTS: Does anyone else in the family have an infection?     Denies  7. TREATMENT: What have you done so far to treat this fever? (e.g., OTC fever medicines)     Ibuprofen  Protocols used: [facility]  Message from [place] S sent at 01/01/2024 12:35 PM EST  Reason for Triage: Temp 101.5 x 2 days, mother states patient is non verbal and not currently displaying any additional symptoms.

## 2024-01-02 ENCOUNTER — Encounter: Payer: Self-pay | Admitting: Family Medicine

## 2024-01-02 ENCOUNTER — Ambulatory Visit (INDEPENDENT_AMBULATORY_CARE_PROVIDER_SITE_OTHER): Admitting: Family Medicine

## 2024-01-02 VITALS — BP 108/70 | Temp 97.3°F | Ht <= 58 in | Wt 100.2 lb

## 2024-01-02 DIAGNOSIS — H66002 Acute suppurative otitis media without spontaneous rupture of ear drum, left ear: Secondary | ICD-10-CM | POA: Diagnosis not present

## 2024-01-02 MED ORDER — AMOXICILLIN 875 MG PO TABS: 875.0000 mg | ORAL_TABLET | Freq: Two times a day (BID) | ORAL | 0 refills | Status: AC

## 2024-01-02 NOTE — Progress Notes (Signed)
 Acute Office Visit  Subjective:     Patient ID: Nathaniel Jimenez, male    DOB: [birth year], 33 y.o.   MRN: [id number]  Chief Complaint  Patient presents with   Fever    COVID negative  Congestion and fever onset 2 days     Fever  This is a new problem. The current episode started yesterday. The problem occurs constantly.   Discussed the use of AI scribe software for clinical note transcription with the patient, who gave verbal consent to proceed.  History of Present Illness   Nathaniel Jimenez is a 33 year old male who presents with fever and congestion.  Parents are with the patient today and they are giving the history. they report fever to 101F with chills for the past several days. Ibuprofen reduces the fever, but it recurs when the medication wears off. He has nasal congestion with significant mucus production that he often swallows. He remains active and is eating and drinking well.  He has no asthma or lung disease but has had prior heart surgery. Has a history of nonverbal autism and Downs syndrome. He uses seasonal Allegra and takes ibuprofen for fever. A COVID test done yesterday was negative. He has possible exposure from his program. He has no difficulty breathing, ear pain, or vomiting.       Review of Systems  Constitutional:  Positive for fever.  All other systems reviewed and are negative.       Objective:    BP 108/70   Temp (!) 97.3 F (36.3 C) (Axillary)   Ht 4' 10 (1.473 m)   Wt 100 lb 3.2 oz (45.5 kg)   BMI 20.94 kg/m    Physical Exam Vitals reviewed.  Constitutional:      General: He is not in acute distress.    Appearance: Normal appearance.  HENT:     Right Ear: Tympanic membrane normal.     Left Ear: Tympanic membrane is erythematous and bulging.  Cardiovascular:     Rate and Rhythm: Normal rate and regular rhythm.     Heart sounds: Murmur heard.  Pulmonary:     Effort: Pulmonary effort is normal.     Breath sounds: Normal breath sounds.  No wheezing.  Lymphadenopathy:     Cervical: No cervical adenopathy.  Neurological:     Mental Status: He is alert.   Exam is limited due to patient's autism.  No results found for any visits on 01/02/24.      Assessment & Plan:   Problem List Items Addressed This Visit   None Visit Diagnoses       Non-recurrent acute suppurative otitis media of left ear without spontaneous rupture of tympanic membrane    -  Primary   Relevant Medications   amoxicillin  (AMOXIL ) 875 MG tablet      Meds ordered this encounter  Medications   amoxicillin  (AMOXIL ) 875 MG tablet    Sig: Take 1 tablet (875 mg total) by mouth 2 (two) times daily for 10 days.    Dispense:  20 tablet    Refill:  0  Assessment and Plan    Acute suppurative otitis media of left ear Acute suppurative otitis media in the left ear, likely causing fever. No respiratory distress or ear pain. COVID test negative. No known antibiotic allergies. Amoxicillin  chosen for its efficacy against common upper respiratory bacteria, including strep and ear infections. - Prescribed amoxicillin  tablets, twice daily for 10 days. - Advised administration with applesauce or liquid  form if preferred. - Continue ibuprofen for fever management as needed. - Use over-the-counter medications like Robitussin DM or Tylenol  cold and sinus for mucus management. - Allow return to program once fever-free, as infection is not contagious. - Proceed with scheduled blood work for thyroid  and prolactin levels.        No follow-ups on file.  Heron CHRISTELLA Sharper, MD

## 2024-01-06 ENCOUNTER — Ambulatory Visit: Payer: Self-pay | Admitting: Internal Medicine

## 2024-01-06 ENCOUNTER — Other Ambulatory Visit (INDEPENDENT_AMBULATORY_CARE_PROVIDER_SITE_OTHER)

## 2024-01-06 DIAGNOSIS — R7989 Other specified abnormal findings of blood chemistry: Secondary | ICD-10-CM

## 2024-01-06 LAB — TSH: TSH: 1.34 u[IU]/mL (ref 0.35–5.50)

## 2024-01-06 LAB — T4, FREE: Free T4: 0.85 ng/dL (ref 0.60–1.60)

## 2024-01-06 LAB — T3, FREE: T3, Free: 2.9 pg/mL (ref 2.3–4.2)

## 2024-01-06 NOTE — Progress Notes (Signed)
 Repeat thyroid   test is normal range   I placed a referral to endocrin ology if you dont hear about an appt  call or contact us 

## 2024-01-07 LAB — PROLACTIN: Prolactin: 7.8 ng/mL (ref 2.0–18.0)
